# Patient Record
Sex: Female | Born: 1965 | Race: White | Hispanic: No | Smoking: Current every day smoker
Health system: Southern US, Community
[De-identification: ages and names within clinical notes are randomized; demographics above are authoritative.]

## PROBLEM LIST (undated history)

## (undated) DIAGNOSIS — M545 Low back pain, unspecified: Secondary | ICD-10-CM

## (undated) DIAGNOSIS — I10 Essential (primary) hypertension: Secondary | ICD-10-CM

## (undated) DIAGNOSIS — M359 Systemic involvement of connective tissue, unspecified: Secondary | ICD-10-CM

## (undated) DIAGNOSIS — Z780 Asymptomatic menopausal state: Secondary | ICD-10-CM

## (undated) DIAGNOSIS — M549 Dorsalgia, unspecified: Secondary | ICD-10-CM

## (undated) DIAGNOSIS — J45909 Unspecified asthma, uncomplicated: Secondary | ICD-10-CM

## (undated) DIAGNOSIS — N6002 Solitary cyst of left breast: Secondary | ICD-10-CM

## (undated) HISTORY — PX: CHOLECYSTECTOMY: SHX55

## (undated) HISTORY — DX: Solitary cyst of left breast: N60.02

## (undated) HISTORY — DX: Essential (primary) hypertension: I10

---

## 2004-08-22 ENCOUNTER — Emergency Department: Payer: Self-pay | Admitting: Emergency Medicine

## 2004-08-23 ENCOUNTER — Ambulatory Visit: Payer: Self-pay | Admitting: Emergency Medicine

## 2004-08-29 ENCOUNTER — Emergency Department: Payer: Self-pay | Admitting: Emergency Medicine

## 2004-09-09 ENCOUNTER — Ambulatory Visit (HOSPITAL_COMMUNITY): Admission: AD | Admit: 2004-09-09 | Discharge: 2004-09-09 | Payer: Self-pay | Admitting: Gynecology

## 2004-11-04 ENCOUNTER — Emergency Department: Payer: Self-pay | Admitting: General Practice

## 2004-12-30 ENCOUNTER — Emergency Department: Payer: Self-pay | Admitting: Emergency Medicine

## 2005-01-28 ENCOUNTER — Emergency Department: Payer: Self-pay | Admitting: Emergency Medicine

## 2005-07-04 ENCOUNTER — Emergency Department: Payer: Self-pay | Admitting: Emergency Medicine

## 2005-09-03 ENCOUNTER — Emergency Department: Payer: Self-pay | Admitting: Internal Medicine

## 2005-09-15 HISTORY — PX: LAPAROSCOPIC ASSISTED VAGINAL HYSTERECTOMY: SHX5398

## 2005-12-26 ENCOUNTER — Emergency Department: Payer: Self-pay | Admitting: Emergency Medicine

## 2006-03-31 ENCOUNTER — Emergency Department: Payer: Self-pay | Admitting: Unknown Physician Specialty

## 2006-04-08 ENCOUNTER — Emergency Department: Payer: Self-pay | Admitting: Emergency Medicine

## 2006-05-04 ENCOUNTER — Ambulatory Visit: Payer: Self-pay | Admitting: Gynecology

## 2006-06-18 ENCOUNTER — Encounter (INDEPENDENT_AMBULATORY_CARE_PROVIDER_SITE_OTHER): Payer: Self-pay | Admitting: Specialist

## 2006-06-18 ENCOUNTER — Ambulatory Visit: Payer: Self-pay | Admitting: Gynecology

## 2006-06-18 ENCOUNTER — Ambulatory Visit (HOSPITAL_COMMUNITY): Admission: RE | Admit: 2006-06-18 | Discharge: 2006-06-19 | Payer: Self-pay | Admitting: Gynecology

## 2006-07-16 ENCOUNTER — Ambulatory Visit: Payer: Self-pay | Admitting: *Deleted

## 2006-07-16 ENCOUNTER — Ambulatory Visit: Payer: Self-pay | Admitting: Obstetrics & Gynecology

## 2006-07-20 ENCOUNTER — Ambulatory Visit: Payer: Self-pay | Admitting: Gynecology

## 2006-09-05 ENCOUNTER — Emergency Department: Payer: Self-pay | Admitting: Emergency Medicine

## 2006-10-07 ENCOUNTER — Emergency Department: Payer: Self-pay | Admitting: Internal Medicine

## 2006-11-26 ENCOUNTER — Ambulatory Visit: Payer: Self-pay | Admitting: Anesthesiology

## 2006-12-30 ENCOUNTER — Ambulatory Visit: Payer: Self-pay | Admitting: Anesthesiology

## 2006-12-31 ENCOUNTER — Emergency Department: Payer: Self-pay | Admitting: Emergency Medicine

## 2007-06-29 ENCOUNTER — Emergency Department: Payer: Self-pay | Admitting: Internal Medicine

## 2007-11-30 ENCOUNTER — Ambulatory Visit: Payer: Self-pay | Admitting: Surgery

## 2007-12-06 ENCOUNTER — Ambulatory Visit: Payer: Self-pay | Admitting: Surgery

## 2007-12-23 ENCOUNTER — Emergency Department: Payer: Self-pay | Admitting: Emergency Medicine

## 2009-07-24 ENCOUNTER — Encounter: Payer: Self-pay | Admitting: Emergency Medicine

## 2009-08-15 ENCOUNTER — Encounter: Payer: Self-pay | Admitting: Emergency Medicine

## 2009-08-31 ENCOUNTER — Emergency Department: Payer: Self-pay | Admitting: Emergency Medicine

## 2009-12-12 ENCOUNTER — Ambulatory Visit (HOSPITAL_COMMUNITY): Admission: RE | Admit: 2009-12-12 | Discharge: 2009-12-12 | Payer: Self-pay

## 2011-01-28 NOTE — Assessment & Plan Note (Signed)
NAME:  Janet Kemp, Janet Kemp NO.:  [id number]   MEDICAL RECORD NO.:  [id number]          PATIENT TYPE:  POB   LOCATION:  [facility] at [facility]         FACILITY:  [facility]   PHYSICIAN:  Carolanne Grumbling, M.D.   DATE OF BIRTH:  1966/02/21   DATE OF SERVICE:  07/16/2006                                    CLINIC NOTE   [age]-year-old female presents with complaint of foul-smelling  vaginal discharge and drainage from her umbilical incision.  She is status  post laparoscopic vaginal hysterectomy on June 19, 2006, by Dr. Mia Creek.  She states that the vaginal discharge has been going on for weeks and that  it has changed colors.  She has douched one time last night.  Denies any  sexual intercourse or anything else in her vagina.  She is also concerned  because she had some yellowish discharge from her umbilicus.  Denies any  fevers or chills, night sweats.   PHYSICAL EXAM:  GENERAL:  Well-developed, well-nourished female in no  apparent distress.  CV:  Regular rate and rhythm.  LUNGS:  Clear to auscultation bilaterally.  ABDOMEN:  Umbilical incision showed yellow discharge with minimal erythema.  Abdomen is soft, nontender, nondistended.  GU EXAM:  Performed by Dr. Penne Lash.  Speculum exam revealed that the vaginal  cuff was friable with little induration.  No evidence of abscess or  malodorous discharge.   ASSESSMENT AND PLAN:  Status post laparoscopic vaginal hysterectomy on  June 19, 2006, with early wound infection.   PLAN:  Follow up with Dr. Mia Creek as previously scheduled on November 5.  Start Augmentin 875/125 b.i.d. x10 days.  Counseled not to have anything in  her vagina.           ______________________________  Carolanne Grumbling, M.D.     TW/MEDQ  D:  07/16/2006  T:  07/16/2006  Job:  [id number]

## 2011-01-31 NOTE — Discharge Summary (Signed)
Janet Kemp, Janet Kemp              ACCOUNT NO.:  [id number]   MEDICAL RECORD NO.:  [id number]          PATIENT TYPE:  OIB   LOCATION:  9315                          FACILITY:  [facility]   PHYSICIAN:  Ginger Carne, MD  DATE OF BIRTH:  08/13/1966   DATE OF ADMISSION:  06/18/2006  DATE OF DISCHARGE:  06/19/2006                                 DISCHARGE SUMMARY   REASON FOR HOSPITALIZATION:  Chronic pelvic pain, endometriosis.   IN-HOSPITAL PROCEDURE:  Laparoscopic-assisted vaginal hysterectomy with  preservation of both tubes and ovaries.   FINAL DIAGNOSES:  1. Stage I endometriosis.  2. Chronic pelvic pain.   HOSPITAL COURSE:  This patient is a 45 year old multiparous female who  underwent the aforementioned procedures on June 18, 2006.  Intraoperative  course was uneventful.   VITAL SIGNS:  Postoperatively, she was afebrile.  Vital signs stable.  LABORATORY:  H&H 12.4 and 35.6, postoperatively.  ABDOMEN:  Soft.  INCISIONS:  Dry.  LUNGS:  Clear.  CALVES:  Without tenderness.   Foley catheter was removed in the early a.m. of June 19, 2006.  Patient  voided satisfactorily.   Patient was discharged with routine postoperative instructions, including  contacting the office for temperature elevation above 100.4 degrees  Fahrenheit, increasing abdominal or incisional pains, increasing vaginal  discharge, bleeding vaginally, genitourinary or gastrointestinal complaints.   Patient was discharged with instructions to return in 4 weeks to the office  for follow-up postoperative visit.  She will continue her home medications  prescribed by her primary care physician, including Xanax, Phenergan,  Lipitor, Percocet, OxyContin, Flexeril, and Nexium.  All doses noted in her  medical record.      Ginger Carne, MD  Electronically Signed     SHB/MEDQ  D:  06/19/2006  T:  06/19/2006  Job:  [id number]

## 2011-01-31 NOTE — Op Note (Signed)
Janet Kemp, Janet Kemp              ACCOUNT NO.:  [id number]   MEDICAL RECORD NO.:  [id number]          PATIENT TYPE:  OIB   LOCATION:  9315                          FACILITY:  [facility]   PHYSICIAN:  Ginger Carne, MD  DATE OF BIRTH:  02-06-1966   DATE OF PROCEDURE:  06/18/2006  DATE OF DISCHARGE:  06/19/2006                                 OPERATIVE REPORT   PREOPERATIVE DIAGNOSES:  Chronic pelvic pain, endometriosis.   POSTOPERATIVE DIAGNOSES:  Chronic pelvic pain, endometriosis.   PROCEDURE:  Laparoscopic-assisted vaginal hysterectomy, preservation of both  tubes and ovaries.   SURGEON:  Ginger Carne, M.D.   ASSISTANT:  None.   COMPLICATIONS:  None immediate.   ESTIMATED BLOOD LOSS:  Minimal.   SPECIMEN:  Uterus, cervix to Pathology.   ANESTHESIA:  General.   OPERATIVE FINDINGS:  External genitalia, vulva and vagina normal.  Cervix  without erosions or lesions.  The laparoscopic evaluation demonstrated  punctate red lesions of the uterus.  Both tubes and ovaries were free of  adhesive disease, endometriosis or other pathology.  No evidence of femoral,  inguinal or obturator hernias.  Upper abdominal contents appeared normal.   OPERATIVE PROCEDURE:  The patient was prepped and draped in the usual  fashion and placed in the lithotomy position.  Betadine solution was used  for antiseptic and the patient was catheterized prior to the procedure.  After adequate general anesthesia, tenaculum placed on the anterior lip of  the cervix and a Pelosi uterine manipulator placed in same.  Afterwards a  vertical infraumbilical incision was made and the Veress needle placed in  the abdomen.  Opening and closing pressures were 10 and 15 mmHg.  Immediate  release trocar placed in the same incision and laparoscope placed in the  trocar sleeve.  Two 5-mm ports were made in the left lower quadrant and left  hypogastric regions under direct visualization.  Ureters identified  bilaterally.  Inspection of pelvic contents and upper abdomen was carried  out.  Following this, the utero-ovarian ligaments were bipolar cauterized  and cut including the tubes and round ligaments on either side.   Direction was then attended to the vaginal portion of the procedure.  Double-  tooth tenaculum placed on the anterior and posterior lips of the cervix.  Marcaine with epinephrine injected circumferentially around the cervix.  Two  centimeters of anterior and posterior vaginal epithelium were incised  transversely by cautery.  Peritoneal reflections identified and opened  without injury to their respective organs.  The uterosacral/cardinal  ligament complexes were clamped, cut and ligated with 0 Vicryl suture.  Uterine vasculature clamped, cut and ligated with 0 Vicryl suture.  Remainder of broad ligaments similarly clamped,  cut and ligated with 0  Vicryl suture.  Uterus and cervix removed.  Bleeding points hemostatically  checked.  Blood clots removed.  Irrigation performed.  Closure of the cuff  in one  layer with 0 Vicryl suture.  Following this, closure of the abdominal  incisions with 0 Vicryl for the 10-mm fascia site and 4-0 Vicryl for  subcuticular closure.  Instrument and sponge  count were correct.  The  patient tolerated the procedure well and returned to the post anesthesia  recovery room in excellent condition.      Ginger Carne, MD  Electronically Signed     SHB/MEDQ  D:  06/18/2006  T:  06/19/2006  Job:  [id number]

## 2011-01-31 NOTE — Op Note (Signed)
NAME:  Janet Kemp, Janet Kemp              ACCOUNT NO.:  [id number]   MEDICAL RECORD NO.:  [id number]          PATIENT TYPE:  AMB   LOCATION:  [facility]                          FACILITY:  [facility]   PHYSICIAN:  Ginger Carne, MD  DATE OF BIRTH:  Feb 23, 1966   DATE OF PROCEDURE:  09/09/2004  DATE OF DISCHARGE:                                 OPERATIVE REPORT   PREOPERATIVE DIAGNOSIS:  Left lower quadrant pain and lower pelvic midline  pain.   POSTOPERATIVE DIAGNOSES:  1.  Stage I endometriosis.  2.  Left omental-pelvic wall adhesions.   SURGEON:  Ginger Carne, M.D.   ASSISTANT:  None.   PROCEDURE:  Diagnostic laparoscopy with lysis of omental adhesions.   COMPLICATIONS:  None immediate.   ESTIMATED BLOOD LOSS:  Negligible.   ANESTHESIA:  General.   SPECIMENS:  None.   OPERATIVE FINDINGS:  Laparoscopic evaluation revealed a normal-sized uterus.  There were some left omental adhesions attached to the left pelvic sidewall,  which were easily removed and avascular.  The left tube and ovary as well as  the broad ligament demonstrated areas on the left side and midline of  endometriosis.  Allen Masters syndrome was present as well.  The right tube  and ovary appeared normal, appendix normal.  The patient had a previous  cholecystectomy.  Anterior bladder flap normal.  The cul-de-sac was free of  adhesive disease.  No evidence of femoral, inguinal, or obturator hernias.   OPERATIVE PROCEDURE:  The patient prepped and draped in the usual fashion  and placed in the lithotomy position, Betadine solution used for antiseptic,  and the patient was catheterized prior to the procedure.  After adequate  general anesthesia, a tenaculum placed on the anterior lip of the cervix and  the Kahn cannula placed in the endocervical canal.   Vertical infraumbilical incision was made and the Veress needle placed in  the opening.  Opening and closing pressures were 10-15 mmHg.  Medial release  trocar  placed in the same incision and the laparoscope placed in the trocar  sleeve.  A probe was placed in the left lower quadrant under direct  visualization, inspection of the pelvic contents carried out.  The  aforementioned omental adhesions were carefully lysed with sharp dissection  without  active bleeding.  Afterwards, gas released, trocars removed.  Closure of the  10 mm fascia site with 0 Vicryl and 4-0 Vicryl for subcuticular closure.  Instrument and sponge counts were correct.  The patient tolerated the  procedure well and returned to the postanesthesia recovery room in excellent  condition.     Stev   SHB/MEDQ  D:  09/09/2004  T:  09/09/2004  Job:  [id number]

## 2012-03-13 ENCOUNTER — Emergency Department (HOSPITAL_COMMUNITY)
Admission: EM | Admit: 2012-03-13 | Discharge: 2012-03-14 | Disposition: A | Discharge status: Home / Self Care | Payer: Medicaid Other | Attending: Emergency Medicine | Admitting: Emergency Medicine

## 2012-03-13 ENCOUNTER — Encounter (HOSPITAL_COMMUNITY): Payer: Self-pay | Admitting: Emergency Medicine

## 2012-03-13 DIAGNOSIS — N949 Unspecified condition associated with female genital organs and menstrual cycle: Secondary | ICD-10-CM

## 2012-03-13 DIAGNOSIS — R197 Diarrhea, unspecified: Secondary | ICD-10-CM | POA: Insufficient documentation

## 2012-03-13 DIAGNOSIS — R1084 Generalized abdominal pain: Secondary | ICD-10-CM | POA: Insufficient documentation

## 2012-03-13 DIAGNOSIS — R109 Unspecified abdominal pain: Secondary | ICD-10-CM

## 2012-03-13 DIAGNOSIS — R110 Nausea: Secondary | ICD-10-CM | POA: Insufficient documentation

## 2012-03-13 DIAGNOSIS — K921 Melena: Secondary | ICD-10-CM | POA: Insufficient documentation

## 2012-03-13 DIAGNOSIS — K625 Hemorrhage of anus and rectum: Secondary | ICD-10-CM

## 2012-03-13 DIAGNOSIS — F17200 Nicotine dependence, unspecified, uncomplicated: Secondary | ICD-10-CM | POA: Insufficient documentation

## 2012-03-13 DIAGNOSIS — Z79899 Other long term (current) drug therapy: Secondary | ICD-10-CM | POA: Insufficient documentation

## 2012-03-13 HISTORY — DX: Dorsalgia, unspecified: M54.9

## 2012-03-13 HISTORY — DX: Asymptomatic menopausal state: Z78.0

## 2012-03-13 NOTE — ED Notes (Signed)
Pt reports LUQ and pain under ribs that  Is progressively worsened over the last 2 weeks also reports lose bowels, and decreased appetite because eating increases pain , denies N/V

## 2012-03-14 ENCOUNTER — Emergency Department (HOSPITAL_COMMUNITY): Payer: Medicaid Other

## 2012-03-14 LAB — LIPASE, BLOOD: Lipase: 12 U/L (ref 11–59)

## 2012-03-14 LAB — URINALYSIS, ROUTINE W REFLEX MICROSCOPIC
Bilirubin Urine: NEGATIVE
Ketones, ur: NEGATIVE mg/dL
pH: 6.5 (ref 5.0–8.0)

## 2012-03-14 LAB — COMPREHENSIVE METABOLIC PANEL
ALT: 8 U/L (ref 0–35)
Albumin: 4 g/dL (ref 3.5–5.2)
Alkaline Phosphatase: 88 U/L (ref 39–117)
CO2: 23 mEq/L (ref 19–32)
Calcium: 9.5 mg/dL (ref 8.4–10.5)
Chloride: 100 mEq/L (ref 96–112)
Creatinine, Ser: 0.61 mg/dL (ref 0.50–1.10)
GFR calc Af Amer: >90 mL/min (ref >90)
GFR calc non Af Amer: >90 mL/min (ref >90)
Potassium: 3.2 mEq/L — ABNORMAL LOW (ref 3.5–5.1)

## 2012-03-14 LAB — POCT I-STAT, CHEM 8
BUN: 5 mg/dL — ABNORMAL LOW (ref 6–23)
Chloride: 102 mEq/L (ref 96–112)
Glucose, Bld: 92 mg/dL (ref 70–99)
Potassium: 3.1 mEq/L — ABNORMAL LOW (ref 3.5–5.1)
Sodium: 138 mEq/L (ref 135–145)

## 2012-03-14 LAB — URINE MICROSCOPIC-ADD ON

## 2012-03-14 LAB — DIFFERENTIAL
Basophils Absolute: 0 10*3/uL (ref 0.0–0.1)
Basophils Relative: 0 % (ref 0–1)
Eosinophils Absolute: 0.1 10*3/uL (ref 0.0–0.7)
Eosinophils Relative: 1 % (ref 0–5)
Lymphocytes Relative: 24 % (ref 12–46)
Monocytes Relative: 6 % (ref 3–12)

## 2012-03-14 LAB — CBC
HCT: 44.7 % (ref 36.0–46.0)
Hemoglobin: 15.7 g/dL — ABNORMAL HIGH (ref 12.0–15.0)
MCHC: 35.1 g/dL (ref 30.0–36.0)

## 2012-03-14 MED ORDER — HYDROMORPHONE HCL PF 1 MG/ML IJ SOLN
1.0000 mg | Freq: Once | INTRAMUSCULAR | Status: AC
  Administered 2012-03-14: 1 mg via INTRAVENOUS
  Filled 2012-03-14: qty 1

## 2012-03-14 MED ORDER — IOHEXOL 300 MG/ML  SOLN
100.0000 mL | Freq: Once | INTRAMUSCULAR | Status: AC | PRN
  Administered 2012-03-14: 100 mL via INTRAVENOUS

## 2012-03-14 MED ORDER — ONDANSETRON HCL 4 MG/2ML IJ SOLN
4.0000 mg | Freq: Once | INTRAMUSCULAR | Status: AC
  Administered 2012-03-14: 4 mg via INTRAVENOUS
  Filled 2012-03-14: qty 2

## 2012-03-14 MED ORDER — OXYCODONE-ACETAMINOPHEN 5-325 MG PO TABS: 1.0000 | ORAL_TABLET | ORAL | Status: AC | PRN

## 2012-03-14 MED ORDER — IOHEXOL 300 MG/ML  SOLN
20.0000 mL | INTRAMUSCULAR | Status: AC
  Administered 2012-03-14: 20 mL via ORAL

## 2012-03-14 MED ORDER — OXYCODONE-ACETAMINOPHEN 5-325 MG PO TABS
1.0000 | ORAL_TABLET | Freq: Once | ORAL | Status: AC
  Administered 2012-03-14: 1 via ORAL
  Filled 2012-03-14: qty 1

## 2012-03-14 NOTE — Discharge Instructions (Signed)
Abdominal (belly) pain can be caused by many things. any cases can be observed and treated at home after initial evaluation in the emergency department. Even though you are being discharged home, abdominal pain can be unpredictable. Therefore, you need a repeated exam if your pain does not resolve, returns, or worsens. Most patients with abdominal pain don't have to be admitted to the hospital or have surgery, but serious problems like appendicitis and gallbladder attacks can start out as nonspecific pain. Many abdominal conditions cannot be diagnosed in one visit, so follow-up evaluations are very important. SEEK IMMEDIATE MEDICAL ATTENTION IF: The pain does not go away or becomes severe, particularly over the next 8-12 hours.  A temperature above 100.60F develops.  Repeated vomiting occurs (multiple episodes).  The pain becomes localized to portions of the abdomen. The right side could possibly be appendicitis. In an adult, the left lower portion of the abdomen could be colitis or diverticulitis.  Blood is being passed in stools or vomit (bright red or black tarry stools).  Return also if you develop chest pain, difficulty breathing, dizziness or fainting, or become confused, poorly responsive  PLEASE STOP TAKING IBUPROFEN AS THIS COULD DAMAGE YOUR STOMACH  ALSO, BE SURE TO HAVE PELVIC ULTRASOUND FOR THE CYST WE TALKED ABOUT.  YOU CAN HAVE YOUR GYNECOLOGIST PERFORM THIS IN THE NEXT MONTH

## 2012-03-14 NOTE — ED Provider Notes (Signed)
History     CSN: [id number]  Arrival date & time 03/13/12  2243   First MD Initiated Contact with Patient 03/14/12 0015      Chief Complaint  Patient presents with  . Abdominal Pain    denies injury     Patient is a 46 y.o. female presenting with abdominal pain. The history is provided by the patient.  Abdominal Pain The primary symptoms of the illness include abdominal pain, nausea, diarrhea and hematochezia. The primary symptoms of the illness do not include fever, shortness of breath or vomiting. The current episode started more than 2 days ago. The onset of the illness was gradual. The problem has been gradually worsening.  Additional symptoms associated with the illness include chills. Symptoms associated with the illness do not include constipation or back pain.  Patient presents for epigastric and LUQ pain Started about week ago and has been gradually worsening The pain seems focused just below left costal margin No trauma No cp/sob No fever She denies ETOH use She does use NSAIDs daily She has never had this pain previously  Past Medical History  Diagnosis Date  . Menopause   . Back pain     Past Surgical History  Procedure Date  . Cholecystectomy   . Abdominal hysterectomy     History reviewed. No pertinent family history.  History  Substance Use Topics  . Smoking status: Current Everyday Smoker  . Smokeless tobacco: Not on file  . Alcohol Use: No    OB History    Grav Para Term Preterm Abortions TAB SAB Ect Mult Living                  Review of Systems  Constitutional: Positive for chills. Negative for fever.  Respiratory: Negative for shortness of breath.   Gastrointestinal: Positive for nausea, abdominal pain, diarrhea and hematochezia. Negative for vomiting and constipation.  Musculoskeletal: Negative for back pain.  All other systems reviewed and are negative.    Allergies  Codeine  Home Medications   Current Outpatient Rx  Name  Route Sig Dispense Refill  . ESTROGENS CONJUGATED 1.25 MG PO TABS Oral Take 1.25 mg by mouth daily.    . FUROSEMIDE 20 MG PO TABS Oral Take 20 mg by mouth daily.    Marland Kitchen METHOCARBAMOL 500 MG PO TABS Oral Take 500 mg by mouth 4 (four) times daily.    Marland Kitchen OMEPRAZOLE 20 MG PO CPDR Oral Take 20 mg by mouth daily.    . OXYCODONE-ACETAMINOPHEN 10-325 MG PO TABS Oral Take 1 tablet by mouth every 4 (four) hours as needed. For pain    . PRESCRIPTION MEDICATION Oral Take 1 tablet by mouth daily. Depression Medication    . OXYCODONE HCL ER 10 MG PO TB12 Oral Take 10 mg by mouth every 12 (twelve) hours.      BP 142/98  Pulse 68  Temp 98.5 F (36.9 C) (Oral)  Resp 18  SpO2 97%  Physical Exam CONSTITUTIONAL: Well developed/well nourished HEAD AND FACE: Normocephalic/atraumatic EYES: EOMI/PERRL, no icterus ENMT: Mucous membranes moist NECK: supple no meningeal signs SPINE:entire spine nontender CV: S1/S2 noted, no murmurs/rubs/gallops noted LUNGS: Lungs are clear to auscultation bilaterally, no apparent distress Chest - tenderness along left lower costal margin but no bruising/crepitance ABDOMEN: soft, tenderness in epigastric and LUQ, tenderness is moderate,  +BS, no rebound/guarding GU:no cva tenderness Rectal - stool color normal, hemorrhoids noted, hemoccult positive, chaperone present NEURO: Pt is awake/alert, moves all extremitiesx4 EXTREMITIES: pulses normal,  full ROM SKIN: warm, color normal PSYCH: no abnormalities of mood noted  ED Course  Procedures   Labs Reviewed  CBC - Abnormal; Notable for the following:    WBC 14.4 (*)     Hemoglobin 15.7 (*)     All other components within normal limits  DIFFERENTIAL - Abnormal; Notable for the following:    Neutro Abs 10.0 (*)     All other components within normal limits  POCT I-STAT, CHEM 8 - Abnormal; Notable for the following:    Potassium 3.1 (*)     BUN 5 (*)     Hemoglobin 16.3 (*)     HCT 48.0 (*)     All other components  within normal limits  OCCULT BLOOD, POC DEVICE  COMPREHENSIVE METABOLIC PANEL  URINALYSIS, ROUTINE W REFLEX MICROSCOPIC  LIPASE, BLOOD   12:47 AM Pt with abdominal pain but does have some rib pain as well.  She appears uncomfortable.  Will follow closely 2:03 AM Pt much improved Abdomen soft and no focal tenderness She reports some bloody stool that had resolved.  No melena on exam, stool color normal.  Did have hemoccult + Doubt acute GI bleed She does take NSAIDs daily, could have gastritis 3:11 AM PT WITH RETURN OF PAIN, WILL GET CT IMAGING 6:12 AM Pt improved We discussed ct findings, and need for outpatient workup of adnexal cyst Advised to cut back on NSAIDs Short course of pain meds given.   MDM  Nursing notes including past medical history and social history reviewed and considered in documentation All labs/vitals reviewed and considered xrays reviewed and considered        Date: 03/14/2012  Rate: 66  Rhythm: normal sinus rhythm  QRS Axis: normal  Intervals: normal  ST/T Wave abnormalities: normal  Conduction Disutrbances:none  Narrative Interpretation:   Old EKG Reviewed: none available at time of interpretation    Joya Gaskins, MD 03/14/12 [phone]

## 2012-03-14 NOTE — ED Notes (Signed)
Patient states she has been having pain to the upper quadrant areas.  Has been going on for about 1 week.  Also states that when she eats she has to go to the bathroom right away.   Abd tender to touch in upper quads

## 2013-05-28 ENCOUNTER — Emergency Department: Payer: Self-pay | Admitting: Emergency Medicine

## 2013-05-28 LAB — URINALYSIS, COMPLETE
Nitrite: NEGATIVE
Specific Gravity: 1.005 (ref 1.003–1.030)
Squamous Epithelial: >30

## 2013-05-28 LAB — CBC
HCT: 38.7 % (ref 35.0–47.0)
MCH: 32 pg (ref 26.0–34.0)
MCV: 91 fL (ref 80–100)
RBC: 4.27 10*6/uL (ref 3.80–5.20)
WBC: 14.1 10*3/uL — ABNORMAL HIGH (ref 3.6–11.0)

## 2013-05-28 LAB — DRUG SCREEN, URINE
Amphetamines, Ur Screen: NEGATIVE (ref ?–1000)
Barbiturates, Ur Screen: NEGATIVE (ref ?–200)
Benzodiazepine, Ur Scrn: NEGATIVE (ref ?–200)
Methadone, Ur Screen: NEGATIVE (ref ?–300)
Phencyclidine (PCP) Ur S: NEGATIVE (ref ?–25)

## 2013-05-28 LAB — ETHANOL: Ethanol: <3 mg/dL

## 2013-05-28 LAB — COMPREHENSIVE METABOLIC PANEL
Alkaline Phosphatase: 131 U/L (ref 50–136)
Anion Gap: 8 (ref 7–16)
Bilirubin,Total: 0.4 mg/dL (ref 0.2–1.0)
Chloride: 98 mmol/L (ref 98–107)
Creatinine: 0.89 mg/dL (ref 0.60–1.30)
Osmolality: 267 (ref 275–301)
SGPT (ALT): 38 U/L (ref 12–78)
Total Protein: 8.9 g/dL — ABNORMAL HIGH (ref 6.4–8.2)

## 2013-05-30 LAB — URINALYSIS, COMPLETE
Nitrite: NEGATIVE
Ph: 6 (ref 4.5–8.0)
RBC,UR: 5 /HPF (ref 0–5)
Specific Gravity: 1.005 (ref 1.003–1.030)
Squamous Epithelial: 19
WBC UR: 29 /HPF (ref 0–5)

## 2014-03-08 ENCOUNTER — Encounter (HOSPITAL_COMMUNITY): Payer: Self-pay | Admitting: Emergency Medicine

## 2014-03-08 ENCOUNTER — Emergency Department (HOSPITAL_COMMUNITY)
Admission: EM | Admit: 2014-03-08 | Discharge: 2014-03-08 | Disposition: A | Discharge status: Home / Self Care | Payer: Medicaid Other | Attending: Emergency Medicine | Admitting: Emergency Medicine

## 2014-03-08 DIAGNOSIS — Z780 Asymptomatic menopausal state: Secondary | ICD-10-CM | POA: Insufficient documentation

## 2014-03-08 DIAGNOSIS — M25561 Pain in right knee: Secondary | ICD-10-CM

## 2014-03-08 DIAGNOSIS — G8929 Other chronic pain: Secondary | ICD-10-CM | POA: Insufficient documentation

## 2014-03-08 DIAGNOSIS — IMO0002 Reserved for concepts with insufficient information to code with codable children: Secondary | ICD-10-CM | POA: Insufficient documentation

## 2014-03-08 DIAGNOSIS — F17200 Nicotine dependence, unspecified, uncomplicated: Secondary | ICD-10-CM | POA: Insufficient documentation

## 2014-03-08 DIAGNOSIS — L089 Local infection of the skin and subcutaneous tissue, unspecified: Secondary | ICD-10-CM

## 2014-03-08 DIAGNOSIS — L723 Sebaceous cyst: Secondary | ICD-10-CM | POA: Insufficient documentation

## 2014-03-08 DIAGNOSIS — Z79899 Other long term (current) drug therapy: Secondary | ICD-10-CM | POA: Insufficient documentation

## 2014-03-08 DIAGNOSIS — M25569 Pain in unspecified knee: Secondary | ICD-10-CM | POA: Insufficient documentation

## 2014-03-08 MED ORDER — NAPROXEN 500 MG PO TABS: 500.0000 mg | ORAL_TABLET | Freq: Two times a day (BID) | ORAL | Status: DC

## 2014-03-08 MED ORDER — TRAMADOL HCL 50 MG PO TABS: 50.0000 mg | ORAL_TABLET | Freq: Four times a day (QID) | ORAL | Status: DC | PRN

## 2014-03-08 NOTE — Discharge Instructions (Signed)
Keep wound clean and dry. Change dressing twice a day. Take pain medications as prescribed. Ice and elevate your knee at home. Knee sleeve for compression. Follow up in 2 days for abscess recheck and packing removal.   Abscess An abscess is an infected area that contains a collection of pus and debris.It can occur in almost any part of the body. An abscess is also known as a furuncle or boil. CAUSES  An abscess occurs when tissue gets infected. This can occur from blockage of oil or sweat glands, infection of hair follicles, or a minor injury to the skin. As the body tries to fight the infection, pus collects in the area and creates pressure under the skin. This pressure causes pain. People with weakened immune systems have difficulty fighting infections and get certain abscesses more often.  SYMPTOMS Usually an abscess develops on the skin and becomes a painful mass that is red, warm, and tender. If the abscess forms under the skin, you may feel a moveable soft area under the skin. Some abscesses break open (rupture) on their own, but most will continue to get worse without care. The infection can spread deeper into the body and eventually into the bloodstream, causing you to feel ill.  DIAGNOSIS  Your caregiver will take your medical history and perform a physical exam. A sample of fluid may also be taken from the abscess to determine what is causing your infection. TREATMENT  Your caregiver may prescribe antibiotic medicines to fight the infection. However, taking antibiotics alone usually does not cure an abscess. Your caregiver may need to make a small cut (incision) in the abscess to drain the pus. In some cases, gauze is packed into the abscess to reduce pain and to continue draining the area. HOME CARE INSTRUCTIONS   Only take over-the-counter or prescription medicines for pain, discomfort, or fever as directed by your caregiver.  If you were prescribed antibiotics, take them as directed.  Finish them even if you start to feel better.  If gauze is used, follow your caregiver's directions for changing the gauze.  To avoid spreading the infection:  Keep your draining abscess covered with a bandage.  Wash your hands well.  Do not share personal care items, towels, or whirlpools with others.  Avoid skin contact with others.  Keep your skin and clothes clean around the abscess.  Keep all follow-up appointments as directed by your caregiver. SEEK MEDICAL CARE IF:   You have increased pain, swelling, redness, fluid drainage, or bleeding.  You have muscle aches, chills, or a general ill feeling.  You have a fever. MAKE SURE YOU:   Understand these instructions.  Will watch your condition.  Will get help right away if you are not doing well or get worse. Document Released: 06/11/2005 Document Revised: 03/02/2012 Document Reviewed: 11/14/2011 [facility] Patient Information 2015 [place]. This information is not intended to replace advice given to you by your health care provider. Make sure you discuss any questions you have with your health care provider.  Knee Pain The knee is the complex joint between your thigh and your lower leg. It is made up of bones, tendons, ligaments, and cartilage. The bones that make up the knee are:  The femur in the thigh.  The tibia and fibula in the lower leg.  The patella or kneecap riding in the groove on the lower femur. CAUSES  Knee pain is a common complaint with many causes. A few of these causes are:  Injury,  such as:  A ruptured ligament or tendon injury.  Torn cartilage.  Medical conditions, such as:  Gout  Arthritis  Infections  Overuse, over training, or overdoing a physical activity. Knee pain can be minor or severe. Knee pain can accompany debilitating injury. Minor knee problems often respond well to self-care measures or get well on their own. More serious injuries may need medical intervention or  even surgery. SYMPTOMS The knee is complex. Symptoms of knee problems can vary widely. Some of the problems are:  Pain with movement and weight bearing.  Swelling and tenderness.  Buckling of the knee.  Inability to straighten or extend your knee.  Your knee locks and you cannot straighten it.  Warmth and redness with pain and fever.  Deformity or dislocation of the kneecap. DIAGNOSIS  Determining what is wrong may be very straight forward such as when there is an injury. It can also be challenging because of the complexity of the knee. Tests to make a diagnosis may include:  Your caregiver taking a history and doing a physical exam.  Routine X-rays can be used to rule out other problems. X-rays will not reveal a cartilage tear. Some injuries of the knee can be diagnosed by:  Arthroscopy a surgical technique by which a small video camera is inserted through tiny incisions on the sides of the knee. This procedure is used to examine and repair internal knee joint problems. Tiny instruments can be used during arthroscopy to repair the torn knee cartilage (meniscus).  Arthrography is a radiology technique. A contrast liquid is directly injected into the knee joint. Internal structures of the knee joint then become visible on X-ray film.  An MRI scan is a non X-ray radiology procedure in which magnetic fields and a computer produce two- or three-dimensional images of the inside of the knee. Cartilage tears are often visible using an MRI scanner. MRI scans have largely replaced arthrography in diagnosing cartilage tears of the knee.  Blood work.  Examination of the fluid that helps to lubricate the knee joint (synovial fluid). This is done by taking a sample out using a needle and a syringe. TREATMENT The treatment of knee problems depends on the cause. Some of these treatments are:  Depending on the injury, proper casting, splinting, surgery, or physical therapy care will be  needed.  Give yourself adequate recovery time. Do not overuse your joints. If you begin to get sore during workout routines, back off. Slow down or do fewer repetitions.  For repetitive activities such as cycling or running, maintain your strength and nutrition.  Alternate muscle groups. For example, if you are a weight lifter, work the upper body on one day and the lower body the next.  Either tight or weak muscles do not give the proper support for your knee. Tight or weak muscles do not absorb the stress placed on the knee joint. Keep the muscles surrounding the knee strong.  Take care of mechanical problems.  If you have flat feet, orthotics or special shoes may help. See your caregiver if you need help.  Arch supports, sometimes with wedges on the inner or outer aspect of the heel, can help. These can shift pressure away from the side of the knee most bothered by osteoarthritis.  A brace called an "unloader" brace also may be used to help ease the pressure on the most arthritic side of the knee.  If your caregiver has prescribed crutches, braces, wraps or ice, use as directed. The acronym  for this is PRICE. This means protection, rest, ice, compression, and elevation.  Nonsteroidal anti-inflammatory drugs (NSAIDs), can help relieve pain. But if taken immediately after an injury, they may actually increase swelling. Take NSAIDs with food in your stomach. Stop them if you develop stomach problems. Do not take these if you have a history of ulcers, stomach pain, or bleeding from the bowel. Do not take without your caregiver's approval if you have problems with fluid retention, heart failure, or kidney problems.  For ongoing knee problems, physical therapy may be helpful.  Glucosamine and chondroitin are over-the-counter dietary supplements. Both may help relieve the pain of osteoarthritis in the knee. These medicines are different from the usual anti-inflammatory drugs. Glucosamine may  decrease the rate of cartilage destruction.  Injections of a corticosteroid drug into your knee joint may help reduce the symptoms of an arthritis flare-up. They may provide pain relief that lasts a few months. You may have to wait a few months between injections. The injections do have a small increased risk of infection, water retention, and elevated blood sugar levels.  Hyaluronic acid injected into damaged joints may ease pain and provide lubrication. These injections may work by reducing inflammation. A series of shots may give relief for as long as 6 months.  Topical painkillers. Applying certain ointments to your skin may help relieve the pain and stiffness of osteoarthritis. Ask your pharmacist for suggestions. Many over the-counter products are approved for temporary relief of arthritis pain.  In some countries, doctors often prescribe topical NSAIDs for relief of chronic conditions such as arthritis and tendinitis. A review of treatment with NSAID creams found that they worked as well as oral medications but without the serious side effects. PREVENTION  Maintain a healthy weight. Extra pounds put more strain on your joints.  Get strong, stay limber. Weak muscles are a common cause of knee injuries. Stretching is important. Include flexibility exercises in your workouts.  Be smart about exercise. If you have osteoarthritis, chronic knee pain or recurring injuries, you may need to change the way you exercise. This does not mean you have to stop being active. If your knees ache after jogging or playing basketball, consider switching to swimming, water aerobics, or other low-impact activities, at least for a few days a week. Sometimes limiting high-impact activities will provide relief.  Make sure your shoes fit well. Choose footwear that is right for your sport.  Protect your knees. Use the proper gear for knee-sensitive activities. Use kneepads when playing volleyball or laying carpet.  Buckle your seat belt every time you drive. Most shattered kneecaps occur in car accidents.  Rest when you are tired. SEEK MEDICAL CARE IF:  You have knee pain that is continual and does not seem to be getting better.  SEEK IMMEDIATE MEDICAL CARE IF:  Your knee joint feels hot to the touch and you have a high fever. MAKE SURE YOU:   Understand these instructions.  Will watch your condition.  Will get help right away if you are not doing well or get worse. Document Released: 06/29/2007 Document Revised: 11/24/2011 Document Reviewed: 06/29/2007 [facility] Patient Information 2015 [place], [place]. This information is not intended to replace advice given to you by your health care provider. Make sure you discuss any questions you have with your health care provider.

## 2014-03-08 NOTE — ED Notes (Signed)
Pt. Stated, i have a cyst on my back and my rt. Knee hurts. For 2 weeks. Abscess in the center of back.

## 2014-03-08 NOTE — ED Provider Notes (Signed)
CSN: [id number]     Arrival date & time 03/08/14  1031 History  This chart was scribed for non-physician practitioner, Lottie Musselatyana A Kirichenko, PA-C, working with Laray AngerKathleen M McManus, DO by Shari HeritageAisha Amuda, ED Scribe. This patient was seen in room TR09C/TR09C and the patient's care was started at 11:00 AM.  Chief Complaint  Patient presents with  . Abscess  . Knee Pain    The history is provided by the patient. No language interpreter was used.    HPI Comments: Janet Kemp is a 48 y.o. female who presents to the Emergency Department complaining of an area of redness, swelling and severe pain to her right upper back. Patient states that she first developed redness and swelling about 3 weeks ago, and then 1 week ago, the area became painful and has progressively worsened since this. Patient states that she has a history of sebaceous cyst to this same spot that was drained in the past. She further reports that she [place] about [place] it removed, but they were unable to do this because it had already been lanced. Patient denies fever, chills, nausea, or vomiting.   She is also complaining of right knee pain with associated swelling for the past 6 days. 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 She states that current episode of pain began after long periods of activity and standing at work. Pain is worse with range of motion of the knee and weight bearing. There is no numbness or weakness of the extremities.    Past Medical History  Diagnosis Date  . Menopause   . Back pain    Past Surgical History  Procedure Laterality Date  . Cholecystectomy    . Abdominal hysterectomy     No family history on file. History  Substance Use Topics  . Smoking status: Current Every Day Smoker  . Smokeless tobacco: Not on file  . Alcohol Use: No   OB History   Grav Para Term Preterm Abortions TAB SAB Ect  Mult Living                 Review of Systems  Constitutional: Negative for fever and chills.  Gastrointestinal: Negative for nausea and vomiting.  Musculoskeletal: Positive for arthralgias (right knee pain).  Skin: Positive for wound.  Neurological: Negative for weakness and numbness.      Allergies  Codeine  Home Medications   Prior to Admission medications   Medication Sig Start Date End Date Taking? Authorizing Provider  estrogens, conjugated, (PREMARIN) 1.25 MG tablet Take 1.25 mg by mouth daily.    Historical Provider, MD  furosemide (LASIX) 20 MG tablet Take 20 mg by mouth daily.    Historical Provider, MD  methocarbamol (ROBAXIN) 500 MG tablet Take 500 mg by mouth 4 (four) times daily.    Historical Provider, MD  omeprazole (PRILOSEC) 20 MG capsule Take 20 mg by mouth daily.    Historical Provider, MD  oxyCODONE (OXYCONTIN) 10 MG 12 hr tablet Take 10 mg by mouth every 12 (twelve) hours.    Historical Provider, MD  oxyCODONE-acetaminophen (PERCOCET) 10-325 MG per tablet Take 1 tablet by mouth every 4 (four) hours as needed. For pain    Historical Provider, MD  PRESCRIPTION MEDICATION Take 1 tablet by mouth daily. Depression Medication    Historical Provider, MD   Triage Vitals: BP 111/90  Pulse 100  Temp(Src) 98  F (36.7 C) (Oral)  Resp 16  Ht 5\' 3"  (1.6 m)  Wt 170 lb (77.111 kg)  BMI 30.12 kg/m2  SpO2 97% Physical Exam  Nursing note and vitals reviewed. Constitutional: She is oriented to person, place, and time. She appears well-developed and well-nourished. No distress.  HENT:  Head: Normocephalic and atraumatic.  Eyes: Conjunctivae and EOM are normal.  Neck: Neck supple. No tracheal deviation present.  Cardiovascular: Normal rate.   Pulmonary/Chest: Effort normal. No respiratory distress.  Musculoskeletal: Normal range of motion.  Normal appearing right knee. Tender to palpation over anterior knee. Patella tendon intact. Pain with movement of patella.  Full  ROM of the knee joint. Pain with full flexion and extension. Negative anterior and posterior drawer signs. No laxity or pain with medial or lateral stress.    Neurological: She is alert and oriented to person, place, and time.  Skin: Skin is warm and dry.  3x3cm abscess. Erythematous. Tender to palpation. Fluctuant. No surrounding erythema or cellulitic changes  Psychiatric: She has a normal mood and affect. Her behavior is normal.    ED Course  Procedures (including critical care time) DIAGNOSTIC STUDIES: Oxygen Saturation is 97% on room air, normal by my interpretation.    COORDINATION OF CARE: 11:04 AM- Performed I&D of cyst to right upper back. Encouraged patient to follow up with dermatologist for removal if cyst recurs. Patient informed of current plan for treatment and evaluation and agrees with plan at this time.  INCISION AND DRAINAGE PROCEDURE NOTE: Patient identification was confirmed and verbal consent was obtained. This procedure was performed by Lottie Musselatyana A Kirichenko, PA-C at 11:04 AM. Site: right upper back Sterile procedures observed Needle size: 25 gauge Anesthetic used (type and amt): 2% lidocaine with epi, 2 cc Blade size: 11 Drainage: large amount of purulent drainage Complexity: Complex Packing used: 1/4 inch iodoform gauze Site anesthetized, incision made over site, wound drained and explored loculations, rinsed with copious amounts of normal saline, wound packed with sterile gauze, covered with dry, sterile dressing.  Pt tolerated procedure well without complications.  Instructions for care discussed verbally and pt provided with additional written instructions for homecare and f/u.   MDM   Final diagnoses:  None    Right knee pain. Chronic, acute exacerbation. No injuries. No indication for imaging. Home with naprosyn, ultram, ice, elevation. Knee sleeve provided for support and swelling.   Abcess/cyst I&Ded with large purulent drainage. Packed. I do  not think pt will need antibiotic treatment. Follow up in 2 days for recheck and packing removal.   Filed Vitals:   03/08/14 1046  BP: 111/90  Pulse: 100  Temp: 98 F (36.7 C)  TempSrc: Oral  Resp: 16  Height: 5\' 3"  (1.6 m)  Weight: 170 lb (77.111 kg)  SpO2: 97%    I personally performed the services described in this documentation, which was scribed in my presence. The recorded information has been reviewed and is accurate.      Lottie Musselatyana A Kirichenko, PA-C 03/08/14 1134

## 2014-03-10 NOTE — ED Provider Notes (Signed)
Medical screening examination/treatment/procedure(s) were performed by non-physician practitioner and as supervising physician I was immediately available for consultation/collaboration.   EKG Interpretation None        Kathleen M McManus, DO 03/10/14 0916 

## 2015-01-05 NOTE — Consult Note (Signed)
Brief Consult Note: Diagnosis: Major depressive disorder, recurrent.   Patient was seen by consultant.   Consult note dictated.   Recommend further assessment or treatment.   Orders entered.   Comments: Comments: [name] has a h/o depression on Viibryd. She was brought to the ER after she became agitated at the police station. She is now cool and collected. She is not suicidal, homicidal or overtly psychotic.Marland Kitchen.   PLAN: 1. The patient no longer meets criteria for IVC. I will terminate proceedings. Please discharge as appropriate.   2. She will follow up with Dr. Lacie ScottsNiemeyer for medication mangment and Dr. Peggye Pittichards for therapy and at [facility] [place] for [place].  3. No medication changes recommended.  Electronic Signatures: Kristine LineaPucilowska, Bettejane Leavens (MD)  (Signed 15-Sep-14 14:01)  Authored: Brief Consult Note   Last Updated: 15-Sep-14 14:01 by Kristine LineaPucilowska, Caelan Branden (MD)

## 2015-01-05 NOTE — Consult Note (Signed)
PATIENT NAME:  Janet Kemp, Janet Kemp MR#:  [id number] DATE OF BIRTH:  [birth year]  DATE OF CONSULTATION:  05/30/2013  REFERRING PHYSICIAN:  Daryel November, MD CONSULTING PHYSICIAN:  Jolanta B. Pucilowska, MD  REASON FOR CONSULTATION: To evaluate a psychotic patient.   IDENTIFYING DATA: Janet Kemp is a 49 year old female with history of mild depression.   CHIEF COMPLAINT: "It's a misunderstanding."   HISTORY OF PRESENT ILLNESS: Janet Kemp was brought to the Emergency Room on petition from her family. Reportedly, she has been hallucinating. The patient tells me that she got really upset after her daughter was arrested and she was worried that her 81-year-old grandson will end up in foster care so following arrest of her daughter  of her talk to him immediately she proceeded to the police station to report that her daughter has multiple charges. She got agitated at the police station and was brought to the Emergency Room. The patient reports a history of mild depression for which she has been prescribed Viibryd by Dr. Lacie Scotts, her primary care provider. She denies any symptoms of depression, anxiety or psychosis. Per her IVC papers, the patient has been hearing voices and receiving messages from TV. She adamantly denied this to intake nurse or myself. She does make strange statement that Beckey Rutter from the [employer] can project things on the wall, but adamantly denies auditory hallucinations, especially command hallucinations and believes that everybody could see the projection on her walls. She cannot explain it better. The patient denies alcohol, illicit substance or prescription drug abuse. She admitted that she is a patient at the [facility] in [place] where she receives a prescription for extended release morphine and Percocet. She reports good compliance with medications and is in good standing with them. Indeed her urine tox screen is positive for opiates. This is prescribed for neck and lower back  pain. Her primary care provider is Dr. Lacie Scotts who also prescribes psychotropic medications.   PAST PSYCHIATRIC HISTORY: The patient denies prior hospitalizations. She does not have a psychiatrist. There were no suicide attempts. She adamantly denies ever having psychotic symptoms.   FAMILY PSYCHIATRIC HISTORY: None reported.   PAST MEDICAL HISTORY: Dyslipidemia, chronic pain, GERD, migraine headache, asthma, irritable bowel syndrome.  ADMISSION MEDICATIONS: 1.  Crestor 20 mg daily. 2.  Viibryd 40 mg daily. 3.  Morphine and oxycodone, unknown doses.   ALLERGIES: No known drug allergies.   SOCIAL HISTORY: The patient lives by herself in a trailer. She works at a [employer]. She used to take care of her grandson up until recently. Her daughter moved out of the patient's house and moved in with her sister, but they did not get along well. Then she returned to the patient's trailer and left again just last Friday and moved in with a boyfriend. The patient is rather worried about her grandson and also misses him as she is very attached to him.  REVIEW OF SYSTEMS: CONSTITUTIONAL: No fevers or chills. No weight changes.  EYES: No double or blurred vision.  ENT: No hearing loss.  RESPIRATORY: No shortness of breath or cough.  CARDIOVASCULAR: No chest pain or orthopnea.  GASTROINTESTINAL: No abdominal pain, nausea, vomiting or diarrhea.  GENITOURINARY: No incontinence or frequency.  ENDOCRINE: No heat or cold intolerance.  LYMPHATIC: No anemia or easy bruising.  INTEGUMENTARY: No acne or rash.  MUSCULOSKELETAL: No muscle or joint pain.  NEUROLOGIC: No tingling or weakness.  PSYCHIATRIC: See history of present illness for details.   PHYSICAL EXAMINATION:  VITAL SIGNS: Blood pressure 123/83, pulse 88, respirations 14, temperature 98.  GENERAL: This is a well-developed female in no acute distress. The rest of the physical examination is deferred to her primary attending.    LABORATORY DATA: Chemistries: Glucose 156, BUN 1, creatinine 0.89, sodium 134, potassium 2.4. Blood alcohol level is zero. LFTs within normal limits, except for AST of 43. TSH 0.59. Urine tox screen is positive for opiates. CBC is within normal limits, except for elevated white blood count of 14.1 and platelets 489. Urinalysis: 3+ leukocyte esterase and over 30 white blood cells with mixed bacteria on urine culture.   MENTAL STATUS EXAMINATION: The patient is alert and oriented to person, place, time and situation. She is pleasant, polite and cooperative. She is in bed in the Emergency Room wearing hospital scrubs. She maintains good eye contact. Her speech is soft. Mood is fine with full affect. Thought process is logical and goal oriented. Thought content: She denies thoughts of hurting herself or others. There are no delusions or paranoia. She denies auditory or visual hallucinations. Prior to admission her family reported that the patient was getting messages from TV. She denies any command hallucinations or messages telling her to hurt herself or others. Her cognition is grossly intact. She registers 3 out of 3 and recalls 3 out of 3 objects after five minutes. She can spell world forward and backward. She knows the current president. Her insight and judgment are fair.   SUICIDE RISK ASSESSMENT: This is a patient with history of mild depression who per family developed some psychotic symptoms. She has no history of schizophrenia. She denies substance use. She is a loving mother and grandmother. She is functioning well and maintaining employment and taking care of her family. She is forward thinking and optimistic about the future   DIAGNOSES: AXIS I: Major depressive disorder.  AXIS II: Deferred.  AXIS III: Chronic pain, dyslipidemia, gastroesophageal reflux disease.  AXIS IV: Global assessment of functioning of 57.   PLAN:  1.  The patient no longer meets criteria for involuntary inpatient  psychiatric commitment. I will terminate proceedings. Please discharge as appropriate.  2.  She wishes to follow up with Dr. Lacie ScottsNiemeyer for medication management and Dr. Peggye Pittichards her therapist whom she sees on an as needed basis. 3.  She will [place] [facility] [place].  4.  No medication change recommended. ____________________________ Ellin GoodieJolanta B. Jennet MaduroPucilowska, MD jbp:sb D: 05/31/2013 11:45:44 ET T: 05/31/2013 12:22:38 ET JOB#: [id number]  cc: Jolanta B. Jennet MaduroPucilowska, MD, <Dictator> Shari ProwsJOLANTA B PUCILOWSKA MD ELECTRONICALLY SIGNED 06/06/2013 6:54

## 2015-05-21 ENCOUNTER — Emergency Department
Admission: EM | Admit: 2015-05-21 | Discharge: 2015-05-21 | Disposition: A | Discharge status: Home / Self Care | Payer: 59 | Attending: Emergency Medicine | Admitting: Emergency Medicine

## 2015-05-21 ENCOUNTER — Encounter: Payer: Self-pay | Admitting: *Deleted

## 2015-05-21 DIAGNOSIS — Z720 Tobacco use: Secondary | ICD-10-CM | POA: Insufficient documentation

## 2015-05-21 DIAGNOSIS — Z791 Long term (current) use of non-steroidal anti-inflammatories (NSAID): Secondary | ICD-10-CM | POA: Diagnosis not present

## 2015-05-21 DIAGNOSIS — L02212 Cutaneous abscess of back [any part, except buttock]: Secondary | ICD-10-CM | POA: Diagnosis present

## 2015-05-21 DIAGNOSIS — Z79899 Other long term (current) drug therapy: Secondary | ICD-10-CM | POA: Insufficient documentation

## 2015-05-21 DIAGNOSIS — L723 Sebaceous cyst: Secondary | ICD-10-CM | POA: Insufficient documentation

## 2015-05-21 DIAGNOSIS — L089 Local infection of the skin and subcutaneous tissue, unspecified: Secondary | ICD-10-CM

## 2015-05-21 MED ORDER — CLINDAMYCIN HCL 300 MG PO CAPS: 300.0000 mg | ORAL_CAPSULE | Freq: Four times a day (QID) | ORAL | Status: DC

## 2015-05-21 MED ORDER — HYDROCODONE-ACETAMINOPHEN 5-325 MG PO TABS: 1.0000 | ORAL_TABLET | Freq: Three times a day (TID) | ORAL | Status: DC | PRN

## 2015-05-21 MED ORDER — LIDOCAINE-EPINEPHRINE (PF) 1 %-1:200000 IJ SOLN
30.0000 mL | Freq: Once | INTRAMUSCULAR | Status: DC
  Filled 2015-05-21: qty 30

## 2015-05-21 NOTE — ED Notes (Addendum)
Pt presents w/ c/o infected cyst on back that is chronic and recurrent in nature x 5 years. Pt states pain and swelling began x 2 weeks ago.

## 2015-05-21 NOTE — ED Provider Notes (Signed)
[facility] Emergency Department Provider Note ?____________________________________________ ? Time seen: 0725 ? I have reviewed the triage vital signs and the nursing notes. ________ HISTORY ? Chief Complaint Abscess  HPI  Janet Kemp is a 49 y.o. female reports to the ED for evaluation of an infected, chronic cyst on her back. She last had it drained about a year and a half ago. She denies any fevers, chills, sweats or nausea and vomiting.   Past Medical History  Diagnosis Date  . Menopause   . Back pain    There are no active problems to display for this patient. ? Past Surgical History  Procedure Laterality Date  . Cholecystectomy    . Abdominal hysterectomy     ? Current Outpatient Rx  Name  Route  Sig  Dispense  Refill  . clindamycin (CLEOCIN) 300 MG capsule   Oral   Take 1 capsule (300 mg total) by mouth 4 (four) times daily.   40 capsule   0   . estradiol (ESTRACE) 0.5 MG tablet   Oral   Take 0.5 mg by mouth daily.         Marland Kitchen HYDROcodone-acetaminophen (NORCO) 5-325 MG per tablet   Oral   Take 1 tablet by mouth every 8 (eight) hours as needed for moderate pain.   12 tablet   0   . ibuprofen (ADVIL,MOTRIN) 200 MG tablet   Oral   Take 200-800 mg by mouth every 6 (six) hours as needed (pain).         . naproxen (NAPROSYN) 500 MG tablet   Oral   Take 1 tablet (500 mg total) by mouth 2 (two) times daily.   30 tablet   0   . omeprazole (PRILOSEC) 40 MG capsule   Oral   Take 40 mg by mouth daily.         . traMADol (ULTRAM) 50 MG tablet   Oral   Take 1 tablet (50 mg total) by mouth every 6 (six) hours as needed.   15 tablet   0    Allergies Codeine ? History reviewed. No pertinent family history. ? Social History Social History  Substance Use Topics  . Smoking status: Current Every Day Smoker  . Smokeless tobacco: None  . Alcohol Use: No   Review of Systems Constitutional: Negative for fever. HEENT:   Normocephalic/atraumatic. Negative for visual/hearingchanges, sore throat, or nasal congestion. Cardiovascular: Negative for chest pain. Respiratory: Negative for shortness of breath. Musculoskeletal: Negative for back pain. Skin: tender swollen cyst Neurological: Negative for headaches, focal weakness or numbness. Hematological/Lymphatic:Negative for enlarged lymph nodes  10-point ROS otherwise negative. ____________________________________________  PHYSICAL EXAM:  VITAL SIGNS: ED Triage Vitals  Enc Vitals Group     BP 05/21/15 0601 114/68 mmHg     Pulse Rate 05/21/15 0601 82     Resp 05/21/15 0601 20     Temp 05/21/15 0601 98.5 F (36.9 C)     Temp Source 05/21/15 0601 Oral     SpO2 05/21/15 0601 96 %     Weight 05/21/15 0601 18 lb (8.165 kg)     Height 05/21/15 0601  (1.6 m)     Head Cir --      Peak Flow --      Pain Score 05/21/15 0602 8     Pain Loc --      Pain Edu? --      Excl. in GC? --    Constitutional: Alert and oriented.  Well appearing and in no distress. HEENT: Normocephalic and atraumatic.Conjunctivae are normal. PERRL. Normal extraocular movements. Mucous membranes are moist. Hematological/Lymphatic/Immunological: No cervical lymphadenopathy. Cardiovascular: Normal rate, regular rhythm.No murmurs, rubs, or gallops.  Respiratory: Normal respiratory effort. Breath sounds are clear and equal bilaterally. No wheezes/rales/rhonchi. Gastrointestinal: Soft and non tender. No distention.  Genitourinary: deferred Musculoskeletal: Nontender with normal range of motion in all extremities. No joint effusions.  No lower extremity tenderness nor edema. Neurologic:  Normal speech and language. No gross focal neurologic deficits are appreciated.  Skin:  Midback with a single, cystic formation centrally located between the shoulder blades. It is firm, tender and erythematous. Psychiatric: Mood and affect are normal. Speech and behavior are normal. Patient exhibits  appropriate insight and judgment. _______________________ INCISION AND DRAINAGE  Performed by: Lissa Hoard Consent: Verbal consent obtained. Risks and benefits: risks, benefits and alternatives were discussed Type: abscess  Body area: midback  Anesthesia: local infiltration  Incision was made with a scalpel.  Local anesthetic: lidocaine 1% w/ epinephrine  Anesthetic total: 5 ml  Complexity: complex Blunt dissection to break up loculations  Drainage: purulent  Drainage amount: 2 ml  Packing material: 1/4 in iodoform gauze  Patient tolerance: Patient tolerated the procedure well with no immediate complications. ______________________________________________________ INITIAL IMPRESSION / ASSESSMENT AND PLAN / ED COURSE ? Incision and drainage of an acutely infected sebaceous cyst to the mid back. Wound care provided. Prescriptions for Vicodin # 15 and clindamycin provided. Patient is to return to 3 days for packing removal and wound check. ____________________________________________ FINAL CLINICAL IMPRESSION(S) / ED DIAGNOSES?  Final diagnoses:  Infected sebaceous cyst of skin       Lissa Hoard, PA-C 05/21/15 1610  Jennye Moccasin, MD 05/21/15 1003

## 2015-05-21 NOTE — ED Notes (Signed)
Pt here with abscess to upper back, chronic in nature.  Pt advises it has to be I&D before.

## 2015-05-21 NOTE — Discharge Instructions (Signed)
° °  Epidermal Cyst An epidermal cyst is sometimes called a sebaceous cyst, epidermal inclusion cyst, or infundibular cyst. These cysts usually contain a substance that looks "pasty" or "cheesy" and may have a bad smell. This substance is a protein called keratin. Epidermal cysts are usually found on the face, neck, or trunk. They may also occur in the vaginal area or other parts of the genitalia of both men and women. Epidermal cysts are usually small, painless, slow-growing bumps or lumps that move freely under the skin. It is important not to try to pop them. This may cause an infection and lead to tenderness and swelling. CAUSES  Epidermal cysts may be caused by a deep penetrating injury to the skin or a plugged hair follicle, often associated with acne. SYMPTOMS  Epidermal cysts can become inflamed and cause:  Redness.  Tenderness.  Increased temperature of the skin over the bumps or lumps.  Grayish-white, bad smelling material that drains from the bump or lump. DIAGNOSIS  Epidermal cysts are easily diagnosed by your caregiver during an exam. Rarely, a tissue sample (biopsy) may be taken to rule out other conditions that may resemble epidermal cysts. TREATMENT   Epidermal cysts often get better and disappear on their own. They are rarely ever cancerous.  If a cyst becomes infected, it may become inflamed and tender. This may require opening and draining the cyst. Treatment with antibiotics may be necessary. When the infection is gone, the cyst may be removed with minor surgery.  Small, inflamed cysts can often be treated with antibiotics or by injecting steroid medicines.  Sometimes, epidermal cysts become large and bothersome. If this happens, surgical removal in your caregiver's office may be necessary. HOME CARE INSTRUCTIONS  Only take over-the-counter or prescription medicines as directed by your caregiver.  Take your antibiotics as directed. Finish them even if you start to feel  better. SEEK MEDICAL CARE IF:   Your cyst becomes tender, red, or swollen.  Your condition is not improving or is getting worse.  You have any other questions or concerns. MAKE SURE YOU:  Understand these instructions.  Will watch your condition.  Will get help right away if you are not doing well or get worse. Document Released: 08/02/2004 Document Revised: 11/24/2011 Document Reviewed: 03/10/2011 [facility] Patient Information 2015 [place], [place]. This information is not intended to replace advice given to you by your health care provider. Make sure you discuss any questions you have with your health care provider.  Take the antibiotic as directed until completely gone. Take the pain medicine as needed.  Apply warm compresses to promote healing.  Follow-up with your provider or [facility] for recheck. See Dermatology for definitive removal of the cyst.

## 2015-11-16 ENCOUNTER — Encounter: Payer: Self-pay | Admitting: *Deleted

## 2015-11-19 ENCOUNTER — Encounter
Admission: RE | Disposition: A | Discharge status: Home / Self Care | Payer: Self-pay | Source: Ambulatory Visit | Attending: Gastroenterology

## 2015-11-19 ENCOUNTER — Ambulatory Visit: Payer: BLUE CROSS/BLUE SHIELD | Admitting: Anesthesiology

## 2015-11-19 ENCOUNTER — Ambulatory Visit
Admission: RE | Admit: 2015-11-19 | Discharge: 2015-11-19 | Disposition: A | Discharge status: Home / Self Care | Payer: BLUE CROSS/BLUE SHIELD | Source: Ambulatory Visit | Attending: Gastroenterology | Admitting: Gastroenterology

## 2015-11-19 ENCOUNTER — Encounter: Payer: Self-pay | Admitting: Anesthesiology

## 2015-11-19 DIAGNOSIS — K219 Gastro-esophageal reflux disease without esophagitis: Secondary | ICD-10-CM | POA: Diagnosis not present

## 2015-11-19 DIAGNOSIS — K2960 Other gastritis without bleeding: Secondary | ICD-10-CM | POA: Diagnosis not present

## 2015-11-19 DIAGNOSIS — R1084 Generalized abdominal pain: Secondary | ICD-10-CM | POA: Diagnosis present

## 2015-11-19 DIAGNOSIS — F17210 Nicotine dependence, cigarettes, uncomplicated: Secondary | ICD-10-CM | POA: Insufficient documentation

## 2015-11-19 DIAGNOSIS — K319 Disease of stomach and duodenum, unspecified: Secondary | ICD-10-CM | POA: Insufficient documentation

## 2015-11-19 DIAGNOSIS — K449 Diaphragmatic hernia without obstruction or gangrene: Secondary | ICD-10-CM | POA: Insufficient documentation

## 2015-11-19 DIAGNOSIS — R112 Nausea with vomiting, unspecified: Secondary | ICD-10-CM | POA: Diagnosis not present

## 2015-11-19 HISTORY — DX: Low back pain, unspecified: M54.50

## 2015-11-19 HISTORY — DX: Low back pain: M54.5

## 2015-11-19 HISTORY — PX: ESOPHAGOGASTRODUODENOSCOPY (EGD) WITH PROPOFOL: SHX5813

## 2015-11-19 LAB — POTASSIUM: Potassium: 3.4 mmol/L — ABNORMAL LOW (ref 3.5–5.1)

## 2015-11-19 SURGERY — ESOPHAGOGASTRODUODENOSCOPY (EGD) WITH PROPOFOL
Anesthesia: General

## 2015-11-19 MED ORDER — SODIUM CHLORIDE 0.9 % IV SOLN
INTRAVENOUS | Status: DC
  Administered 2015-11-19: 1000 mL via INTRAVENOUS
  Administered 2015-11-19: 13:00:00 via INTRAVENOUS

## 2015-11-19 MED ORDER — MIDAZOLAM HCL 2 MG/2ML IJ SOLN
INTRAMUSCULAR | Status: DC | PRN
  Administered 2015-11-19: 1 mg via INTRAVENOUS

## 2015-11-19 MED ORDER — FENTANYL CITRATE (PF) 100 MCG/2ML IJ SOLN
INTRAMUSCULAR | Status: DC | PRN
  Administered 2015-11-19: 50 ug via INTRAVENOUS

## 2015-11-19 MED ORDER — PROPOFOL 500 MG/50ML IV EMUL
INTRAVENOUS | Status: DC | PRN
  Administered 2015-11-19: 150 ug/kg/min via INTRAVENOUS

## 2015-11-19 MED ORDER — PROPOFOL 10 MG/ML IV BOLUS
INTRAVENOUS | Status: DC | PRN
  Administered 2015-11-19: 30 mg via INTRAVENOUS
  Administered 2015-11-19: 40 mg via INTRAVENOUS

## 2015-11-19 NOTE — Anesthesia Preprocedure Evaluation (Signed)
Anesthesia Evaluation  Patient identified by MRN, date of birth, ID band Patient awake    Reviewed: Allergy & Precautions, NPO status , Patient's Chart, lab work & pertinent test results, reviewed documented beta blocker date and time   Airway Mallampati: II  TM Distance: >3 FB     Dental  (+) Chipped   Pulmonary Current Smoker,           Cardiovascular      Neuro/Psych    GI/Hepatic   Endo/Other    Renal/GU      Musculoskeletal   Abdominal   Peds  Hematology   Anesthesia Other Findings   Reproductive/Obstetrics                             Anesthesia Physical Anesthesia Plan  ASA: II  Anesthesia Plan: General   Post-op Pain Management:    Induction: Intravenous  Airway Management Planned: Nasal Cannula  Additional Equipment:   Intra-op Plan:   Post-operative Plan:   Informed Consent: I have reviewed the patients History and Physical, chart, labs and discussed the procedure including the risks, benefits and alternatives for the proposed anesthesia with the patient or authorized representative who has indicated his/her understanding and acceptance.     Plan Discussed with: CRNA  Anesthesia Plan Comments:         Anesthesia Quick Evaluation  

## 2015-11-19 NOTE — Transfer of Care (Signed)
Immediate Anesthesia Transfer of Care Note  Patient: Janet Kemp  Procedure(s) Performed: Procedure(s): ESOPHAGOGASTRODUODENOSCOPY (EGD) WITH PROPOFOL (N/A)  Patient Location: Endoscopy Unit  Anesthesia Type:General  Level of Consciousness: sedated  Airway & Oxygen Therapy: Patient Spontanous Breathing and Patient connected to nasal cannula oxygen  Post-op Assessment: Report given to RN and Post -op Vital signs reviewed and stable  Post vital signs: Reviewed and stable  Last Vitals:  Filed Vitals:   11/19/15 1225  BP: 106/62  Pulse: 76  Temp: 36.5 C  Resp: 20    Complications: No apparent anesthesia complications

## 2015-11-19 NOTE — Anesthesia Procedure Notes (Signed)
Date/Time: 11/19/2015 1:11 PM Performed by: Junious SilkNOLES, Chablis Losh Pre-anesthesia Checklist: Patient identified, Emergency Drugs available, Suction available, Patient being monitored and Timeout performed Oxygen Delivery Method: Nasal cannula

## 2015-11-19 NOTE — Op Note (Signed)
[facility] Gastroenterology Patient Name: Janet Kemp Procedure Date: 11/19/2015 1:01 PM MRN: [id number] Account #: [id number] Date of Birth: [birth year] Admit Type: Outpatient Age: [age] [facility] ENDO ROOM 2 Gender: Female Note Status: Finalized Procedure:            Upper GI endoscopy Indications:          Generalized abdominal pain, Nausea with vomiting Patient Profile:      This is a 50 year old female. Providers:            Janet RaiderMatthew G. Shelle Ironein, MD Referring MD:         Janet IvanMeindert A. Lacie ScottsNiemeyer, MD (Referring MD) Medicines:            Propofol per Anesthesia Complications:        No immediate complications. Estimated blood loss:                        Minimal. Procedure:            Pre-Anesthesia Assessment:                       - Prior to the procedure, a History and Physical was                        performed, and patient medications, allergies and                        sensitivities were reviewed. The patient's tolerance of                        previous anesthesia was reviewed.                       After obtaining informed consent, the endoscope was                        passed under direct vision. Throughout the procedure,                        the patient's blood pressure, pulse, and oxygen                        saturations were monitored continuously. The Endoscope                        was introduced through the mouth, and advanced to the                        second part of duodenum. The upper GI endoscopy was                        accomplished without difficulty. The patient tolerated                        the procedure well. Findings:      A small hiatal hernia was present. Esophagus otherwise normal.      Multiple dispersed 2 to 4 mm erosions were found in the gastric body and       in the gastric antrum. There were no stigmata of recent bleeding.       Biopsies were taken with a cold  forceps for histology.      The examined duodenum was  normal. Impression:           - Small hiatal hernia.                       - Erosive gastropathy. Biopsied.                       - Normal examined duodenum. Recommendation:       - Observe patient in GI recovery unit.                       - Resume regular diet.                       - Continue present medications.                       - Await pathology results.                       - Avoid NSAIDS.                       - Use omeprazole  daily, take 30 minutes before                        breakfast                       - Obtain CT a/p and CT head for am nausea/vomiting.                       - The findings and recommendations were discussed with                        the patient.                       - The findings and recommendations were discussed with                        the patient's family. Procedure Code(s):    --- Professional ---                       [phone], Esophagogastroduodenoscopy, flexible, transoral;                        with biopsy, single or multiple Diagnosis Code(s):    --- Professional ---                       K44.9, Diaphragmatic hernia without obstruction or                        gangrene                       K31.89, Other diseases of stomach and duodenum                       R10.84, Generalized abdominal pain                       R11.2, Nausea with vomiting, unspecified  CPT copyright 2016 [employer]. All rights reserved. The codes documented in this report are preliminary and upon coder review may  be revised to meet current compliance requirements. Janet Frames, MD 11/19/2015 1:32:08 PM This report has been signed electronically. Number of Addenda: 0 Note Initiated On: 11/19/2015 1:01 PM      [facility]

## 2015-11-19 NOTE — Anesthesia Postprocedure Evaluation (Signed)
Anesthesia Post Note  Patient: Janet CannerLeslie J Kemp  Procedure(Kemp) Performed: Procedure(Kemp) (LRB): ESOPHAGOGASTRODUODENOSCOPY (EGD) WITH PROPOFOL (N/A)  Patient location during evaluation: Endoscopy Anesthesia Type: General Level of consciousness: awake and alert Pain management: pain level controlled Vital Signs Assessment: post-procedure vital signs reviewed and stable Respiratory status: spontaneous breathing, nonlabored ventilation, respiratory function stable and patient connected to nasal cannula oxygen Cardiovascular status: blood pressure returned to baseline and stable Postop Assessment: no signs of nausea or vomiting Anesthetic complications: no    Last Vitals:  Filed Vitals:   11/19/15 1324 11/19/15 1334  BP: 144/90 129/79  Pulse: 65 60  Temp: 35.8 C   Resp: 10 19    Last Pain: There were no vitals filed for this visit.               Janet Kemp

## 2015-11-19 NOTE — H&P (Signed)
  Primary Care Physician:  Evelene CroonNIEMEYER, MEINDERT, MD  Pre-Procedure History & Physical: HPI:  Janet Kemp is a 50 y.o. female is here for an endoscopy.   Past Medical History  Diagnosis Date  . Menopause   . Back pain   . Lumbago     Past Surgical History  Procedure Laterality Date  . Cholecystectomy    . Abdominal hysterectomy      Prior to Admission medications   Medication Sig Start Date End Date Taking? Authorizing Provider  omeprazole (PRILOSEC) 40 MG capsule Take 40 mg by mouth daily.   Yes Historical Provider, MD  clindamycin (CLEOCIN) 300 MG capsule Take 1 capsule (300 mg total) by mouth 4 (four) times daily. 05/21/15   Jenise V Bacon Menshew, PA-C  estradiol (ESTRACE) 0.5 MG tablet Take 0.5 mg by mouth daily.    Historical Provider, MD  HYDROcodone-acetaminophen (NORCO) 5-325 MG per tablet Take 1 tablet by mouth every 8 (eight) hours as needed for moderate pain. 05/21/15   Jenise V Bacon Menshew, PA-C  ibuprofen (ADVIL,MOTRIN) 200 MG tablet Take 200-800 mg by mouth every 6 (six) hours as needed (pain).    Historical Provider, MD  naproxen (NAPROSYN) 500 MG tablet Take 1 tablet (500 mg total) by mouth 2 (two) times daily. 03/08/14   Tatyana Kirichenko, PA-C  traMADol (ULTRAM) 50 MG tablet Take 1 tablet (50 mg total) by mouth every 6 (six) hours as needed. 03/08/14   Tatyana Kirichenko, PA-C    Allergies as of 11/13/2015 - Review Complete 05/21/2015  Allergen Reaction Noted  . Codeine Itching 03/13/2012    History reviewed. No pertinent family history.  Social History   Social History  . Marital Status: Widowed    Spouse Name: N/A  . Number of Children: N/A  . Years of Education: N/A   Occupational History  . Not on file.   Social History Main Topics  . Smoking status: Current Every Day Smoker  . Smokeless tobacco: Not on file  . Alcohol Use: No  . Drug Use: No  . Sexual Activity: No   Other Topics Concern  . Not on file   Social History Narrative      Physical Exam: BP 106/62 mmHg  Pulse 76  Temp(Src) 97.7 F (36.5 C) (Tympanic)  Resp 20  Ht 5\' 3"  (1.6 m)  Wt 68.04 kg (150 lb)  BMI 26.58 kg/m2 General:   Alert,  pleasant and cooperative in NAD Head:  Normocephalic and atraumatic. Neck:  Supple; no masses or thyromegaly. Lungs:  Clear throughout to auscultation.    Heart:  Regular rate and rhythm. Abdomen:  Soft, nontender and nondistended. Normal bowel sounds, without guarding, and without rebound.   Neurologic:  Alert and  oriented x4;  grossly normal neurologically.  Impression/Plan: Janet GarbeLeslie J Lupu is here for an endoscopy to be performed for n/v, GERD, abd pain  Risks, benefits, limitations, and alternatives regarding  endoscopy have been reviewed with the patient.  Questions have been answered.  All parties agreeable.   Elnita MaxwellEIN, Dechelle Attaway GORDON, MD  11/19/2015, 1:00 PM

## 2015-11-19 NOTE — Discharge Instructions (Signed)

## 2015-11-20 LAB — SURGICAL PATHOLOGY

## 2015-11-29 ENCOUNTER — Other Ambulatory Visit: Payer: Self-pay | Admitting: Gastroenterology

## 2015-11-29 DIAGNOSIS — R634 Abnormal weight loss: Secondary | ICD-10-CM

## 2015-11-29 DIAGNOSIS — R112 Nausea with vomiting, unspecified: Secondary | ICD-10-CM

## 2015-11-30 ENCOUNTER — Ambulatory Visit: Payer: BLUE CROSS/BLUE SHIELD

## 2015-11-30 ENCOUNTER — Ambulatory Visit
Admit: 2015-11-30 | Discharge: 2015-11-30 | Disposition: A | Discharge status: Home / Self Care | Payer: BLUE CROSS/BLUE SHIELD | Attending: Gastroenterology | Admitting: Gastroenterology

## 2015-11-30 DIAGNOSIS — R634 Abnormal weight loss: Secondary | ICD-10-CM | POA: Diagnosis present

## 2015-11-30 DIAGNOSIS — R112 Nausea with vomiting, unspecified: Secondary | ICD-10-CM | POA: Insufficient documentation

## 2015-11-30 DIAGNOSIS — J439 Emphysema, unspecified: Secondary | ICD-10-CM | POA: Diagnosis not present

## 2015-11-30 DIAGNOSIS — I700 Atherosclerosis of aorta: Secondary | ICD-10-CM | POA: Diagnosis not present

## 2015-11-30 DIAGNOSIS — K5900 Constipation, unspecified: Secondary | ICD-10-CM | POA: Diagnosis not present

## 2015-11-30 HISTORY — DX: Unspecified asthma, uncomplicated: J45.909

## 2015-11-30 HISTORY — DX: Systemic involvement of connective tissue, unspecified: M35.9

## 2015-11-30 MED ORDER — IOHEXOL 300 MG/ML  SOLN
100.0000 mL | Freq: Once | INTRAMUSCULAR | Status: AC | PRN
  Administered 2015-11-30: 100 mL via INTRAVENOUS

## 2016-10-20 ENCOUNTER — Emergency Department
Admission: EM | Admit: 2016-10-20 | Discharge: 2016-10-20 | Disposition: A | Discharge status: Home / Self Care | Payer: BLUE CROSS/BLUE SHIELD | Attending: Emergency Medicine | Admitting: Emergency Medicine

## 2016-10-20 DIAGNOSIS — R112 Nausea with vomiting, unspecified: Secondary | ICD-10-CM | POA: Insufficient documentation

## 2016-10-20 DIAGNOSIS — R05 Cough: Secondary | ICD-10-CM | POA: Diagnosis not present

## 2016-10-20 DIAGNOSIS — J029 Acute pharyngitis, unspecified: Secondary | ICD-10-CM | POA: Diagnosis not present

## 2016-10-20 DIAGNOSIS — Z79899 Other long term (current) drug therapy: Secondary | ICD-10-CM | POA: Insufficient documentation

## 2016-10-20 DIAGNOSIS — R509 Fever, unspecified: Secondary | ICD-10-CM | POA: Insufficient documentation

## 2016-10-20 DIAGNOSIS — J45909 Unspecified asthma, uncomplicated: Secondary | ICD-10-CM | POA: Insufficient documentation

## 2016-10-20 DIAGNOSIS — R6889 Other general symptoms and signs: Secondary | ICD-10-CM

## 2016-10-20 DIAGNOSIS — F17200 Nicotine dependence, unspecified, uncomplicated: Secondary | ICD-10-CM | POA: Insufficient documentation

## 2016-10-20 DIAGNOSIS — R51 Headache: Secondary | ICD-10-CM | POA: Insufficient documentation

## 2016-10-20 DIAGNOSIS — R197 Diarrhea, unspecified: Secondary | ICD-10-CM | POA: Insufficient documentation

## 2016-10-20 MED ORDER — ONDANSETRON 4 MG PO TBDP: 4.0000 mg | ORAL_TABLET | Freq: Three times a day (TID) | ORAL | 0 refills | Status: DC | PRN

## 2016-10-20 MED ORDER — ONDANSETRON HCL 4 MG/2ML IJ SOLN
4.0000 mg | Freq: Once | INTRAMUSCULAR | Status: AC
  Administered 2016-10-20: 4 mg via INTRAVENOUS
  Filled 2016-10-20: qty 2

## 2016-10-20 MED ORDER — NAPROXEN 500 MG PO TABS: 500.0000 mg | ORAL_TABLET | Freq: Two times a day (BID) | ORAL | 0 refills | Status: DC

## 2016-10-20 MED ORDER — HYDROCOD POLST-CPM POLST ER 10-8 MG/5ML PO SUER: 5.0000 mL | Freq: Two times a day (BID) | ORAL | 0 refills | Status: DC

## 2016-10-20 MED ORDER — SODIUM CHLORIDE 0.9 % IV BOLUS (SEPSIS)
1000.0000 mL | Freq: Once | INTRAVENOUS | Status: AC
  Administered 2016-10-20: 1000 mL via INTRAVENOUS

## 2016-10-20 MED ORDER — KETOROLAC TROMETHAMINE 30 MG/ML IJ SOLN
15.0000 mg | Freq: Once | INTRAMUSCULAR | Status: AC
  Administered 2016-10-20: 15 mg via INTRAVENOUS
  Filled 2016-10-20: qty 1

## 2016-10-20 NOTE — ED Provider Notes (Signed)
[facility] Emergency Department Provider Note  ____________________________________________  Time seen: Approximately 8:39 PM  I have reviewed the triage vital signs and the nursing notes.   HISTORY  Chief Complaint Influenza   HPI Janet Kemp is a 51 y.o. female who presents to the emergency department for evaluation of headache, bilateral earache, generalized body aches, dry cough with runny nose and nasal congestion. Symptoms started 2 days ago. She is had multiple episodes of vomiting and diarrhea. She has been unable to keep fluids down this evening. She has taken TheraFlu, Tylenol, ibuprofen, and fixed vapor rub without relief.   Past Medical History:  Diagnosis Date  . Asthma   . Back pain   . Collagen vascular disease (HCC)   . Lumbago   . Menopause     There are no active problems to display for this patient.   Past Surgical History:  Procedure Laterality Date  . ABDOMINAL HYSTERECTOMY    . CHOLECYSTECTOMY    . ESOPHAGOGASTRODUODENOSCOPY (EGD) WITH PROPOFOL N/A 11/19/2015   Procedure: ESOPHAGOGASTRODUODENOSCOPY (EGD) WITH PROPOFOL;  Surgeon: Elnita Maxwell, MD;  Location: [facility] END[facility]COPY;  Service: Endoscopy;  Laterality: N/A;    Prior to Admission medications   Medication Sig Start Date End Date Taking? Authorizing Provider  chlorpheniramine-HYDROcodone (TUSSIONEX PENNKINETIC ER) 10-8 MG/5ML SUER Take 5 mLs by mouth 2 (two) times daily. 10/20/16   Chinita Pester, FNP  clindamycin (CLEOCIN) 300 MG capsule Take 1 capsule (300 mg total) by mouth 4 (four) times daily. 05/21/15   Jenise V Bacon Menshew, PA-C  estradiol (ESTRACE) 0.5 MG tablet Take 0.5 mg by mouth daily.    Historical Provider, MD  HYDROcodone-acetaminophen (NORCO) 5-325 MG per tablet Take 1 tablet by mouth every 8 (eight) hours as needed for moderate pain. 05/21/15   Jenise V Bacon Menshew, PA-C  ibuprofen (ADVIL,MOTRIN) 200 MG tablet Take 200-800 mg by mouth every 6 (six)  hours as needed (pain).    Historical Provider, MD  naproxen (NAPROSYN) 500 MG tablet Take 1 tablet (500 mg total) by mouth 2 (two) times daily with a meal. 10/20/16   Raynald Rouillard B Takesha Steger, FNP  omeprazole (PRILOSEC) 40 MG capsule Take 40 mg by mouth daily.    Historical Provider, MD  ondansetron (ZOFRAN-ODT) 4 MG disintegrating tablet Take 1 tablet (4 mg total) by mouth every 8 (eight) hours as needed for nausea or vomiting. 10/20/16   Chinita Pester, FNP  traMADol (ULTRAM) 50 MG tablet Take 1 tablet (50 mg total) by mouth every 6 (six) hours as needed. 03/08/14   Tatyana Kirichenko, PA-C    Allergies Codeine  No family history on file.  Social History Social History  Substance Use Topics  . Smoking status: Current Every Day Smoker  . Smokeless tobacco: Never Used  . Alcohol use No    Review of Systems Constitutional: Positive for fever/chills ENT: Positive for sore throat. Cardiovascular: Denies chest pain. Respiratory: Negative for shortness of breath. Positive for cough. Gastrointestinal: Positive for nausea,  positive vomiting.  Positive for diarrhea.  Musculoskeletal: Positive for body aches Skin: Negative for rash. Neurological: Positive for headaches ____________________________________________   PHYSICAL EXAM:  VITAL SIGNS: ED Triage Vitals  Enc Vitals Group     BP 10/20/16 2002 109/82     Pulse Rate 10/20/16 2002 85     Resp 10/20/16 2002 15     Temp 10/20/16 2002 98.4 F (36.9 C)     Temp Source 10/20/16 2002 Oral  SpO2 10/20/16 2002 98 %     Weight 10/20/16 2003 155 lb (70.3 kg)     Height 10/20/16 2003 5\' 3"  (1.6 m)     Head Circumference --      Peak Flow --      Pain Score 10/20/16 2003 7     Pain Loc --      Pain Edu? --      Excl. in GC? --     Constitutional: Alert and oriented. Acutely ill appearing and in no acute distress. Eyes: Conjunctivae are normal. EOMI. Ears: Bilateral tympanic membranes mildly injected but without loss of light  reflex Nose: No congestion; clear rhinnorhea. Mouth/Throat: Mucous membranes are moist.  Oropharynx nonerythematous. Tonsils not visualized. Neck: No stridor.  Lymphatic: No cervical lymphadenopathy. Cardiovascular: Normal rate, regular rhythm. Grossly normal heart sounds.  Good peripheral circulation. Respiratory: Normal respiratory effort.  No retractions. Clear to auscultation throughout. Gastrointestinal: Soft and nontender.  Musculoskeletal: FROM x 4 extremities.  Neurologic:  Normal speech and language.  Skin:  Skin is warm, dry and intact. No rash noted. Psychiatric: Mood and affect are normal. Speech and behavior are normal.  ____________________________________________   LABS (all labs ordered are listed, but only abnormal results are displayed)  Labs Reviewed - No data to display ____________________________________________  EKG   ____________________________________________  RADIOLOGY  Not indicated ____________________________________________   PROCEDURES  Procedure(s) performed: None  Critical Care performed: No  ____________________________________________   INITIAL IMPRESSION / ASSESSMENT AND PLAN / ED COURSE     Pertinent labs & imaging results that were available during my care of the patient were reviewed by me and considered in my medical decision making (see chart for details).   51 year old female presenting to the emergency department for influenza-like illness. She will be given 1 L of IV fluids, Zofran, and Toradol while in the emergency department due to 2 inability to tolerate fluids this evening and plate of headache and body aches.  ----------------------------------------- 10:51 PM on 10/20/2016 -----------------------------------------  Plan discharge patient home after IV fluids complete. She is had no additional vomiting and denies additional episodes of diarrhea since she has been in the emergency department. She reports  resolution of her nausea and decrease in headache. She will be discharged home with Zofran, Naprosyn, and Tussionex. She was advised follow-up with her primary care provider for symptoms that are not improving over the next few days or return to the emergency department for symptoms that change or worsen and she is unable schedule an appointment. ____________________________________________   FINAL CLINICAL IMPRESSION(S) / ED DIAGNOSES  Final diagnoses:  Flu-like symptoms    Note:  This document was prepared using Dragon voice recognition software and may include unintentional dictation errors.     Chinita PesterCari B Oaklen Thiam, FNP 10/20/16 2252    Phineas SemenGraydon Goodman, MD 10/20/16 2253

## 2016-10-20 NOTE — Discharge Instructions (Signed)
Follow up with your primary care provider for symptoms that are not improving over the next few days.  Return to the ER for symptoms that change or worsen if unable to schedule an appointment. 

## 2016-10-20 NOTE — ED Triage Notes (Signed)
Pt c/o severe headache, bilat ear pain, generalized body aches and dry cough with runny nose and nasal congestion

## 2016-10-20 NOTE — ED Notes (Signed)
Pt discharged to home.  Family member driving.  Discharge instructions reviewed.  Verbalized understanding.  No questions or concerns at this time.  Teach back verified.  Pt in NAD.  No items left in ED.   

## 2017-01-06 ENCOUNTER — Inpatient Hospital Stay (HOSPITAL_COMMUNITY)
Admission: AD | Admit: 2017-01-06 | Discharge: 2017-01-06 | Disposition: A | Discharge status: Home / Self Care | Payer: BLUE CROSS/BLUE SHIELD | Source: Ambulatory Visit | Attending: Family Medicine | Admitting: Family Medicine

## 2017-01-06 DIAGNOSIS — M359 Systemic involvement of connective tissue, unspecified: Secondary | ICD-10-CM | POA: Insufficient documentation

## 2017-01-06 DIAGNOSIS — I10 Essential (primary) hypertension: Secondary | ICD-10-CM | POA: Diagnosis not present

## 2017-01-06 DIAGNOSIS — G43909 Migraine, unspecified, not intractable, without status migrainosus: Secondary | ICD-10-CM | POA: Insufficient documentation

## 2017-01-06 DIAGNOSIS — Z90710 Acquired absence of both cervix and uterus: Secondary | ICD-10-CM | POA: Insufficient documentation

## 2017-01-06 DIAGNOSIS — F17200 Nicotine dependence, unspecified, uncomplicated: Secondary | ICD-10-CM | POA: Insufficient documentation

## 2017-01-06 DIAGNOSIS — Z885 Allergy status to narcotic agent status: Secondary | ICD-10-CM | POA: Insufficient documentation

## 2017-01-06 DIAGNOSIS — Z9049 Acquired absence of other specified parts of digestive tract: Secondary | ICD-10-CM | POA: Diagnosis not present

## 2017-01-06 MED ORDER — PROMETHAZINE HCL 25 MG/ML IJ SOLN
25.0000 mg | Freq: Once | INTRAMUSCULAR | Status: AC
  Administered 2017-01-06: 25 mg via INTRAMUSCULAR
  Filled 2017-01-06: qty 1

## 2017-01-06 MED ORDER — KETOROLAC TROMETHAMINE 60 MG/2ML IM SOLN
60.0000 mg | INTRAMUSCULAR | Status: AC
  Administered 2017-01-06: 60 mg via INTRAMUSCULAR
  Filled 2017-01-06: qty 2
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
## 2017-04-13 ENCOUNTER — Ambulatory Visit (INDEPENDENT_AMBULATORY_CARE_PROVIDER_SITE_OTHER): Payer: BLUE CROSS/BLUE SHIELD | Admitting: Obstetrics and Gynecology

## 2017-04-13 ENCOUNTER — Encounter: Payer: Self-pay | Admitting: Obstetrics and Gynecology

## 2017-04-13 VITALS — BP 120/80 | HR 75 | Ht 63.0 in | Wt 155.0 lb

## 2017-04-13 DIAGNOSIS — Z113 Encounter for screening for infections with a predominantly sexual mode of transmission: Secondary | ICD-10-CM

## 2017-04-13 DIAGNOSIS — Z01419 Encounter for gynecological examination (general) (routine) without abnormal findings: Secondary | ICD-10-CM

## 2017-04-13 DIAGNOSIS — N952 Postmenopausal atrophic vaginitis: Secondary | ICD-10-CM

## 2017-04-13 DIAGNOSIS — Z01411 Encounter for gynecological examination (general) (routine) with abnormal findings: Secondary | ICD-10-CM

## 2017-04-13 DIAGNOSIS — N941 Unspecified dyspareunia: Secondary | ICD-10-CM | POA: Insufficient documentation

## 2017-04-13 DIAGNOSIS — N6002 Solitary cyst of left breast: Secondary | ICD-10-CM | POA: Insufficient documentation

## 2017-04-13 MED ORDER — FLUCONAZOLE 150 MG PO TABS: 150.0000 mg | ORAL_TABLET | Freq: Once | ORAL | 2 refills | Status: AC

## 2017-04-13 MED ORDER — ESTRADIOL 2 MG VA RING: 2.0000 mg | VAGINAL_RING | VAGINAL | 3 refills | Status: DC

## 2017-04-13 NOTE — Progress Notes (Signed)
Obstetrics and Gynecology Annual Patient Evaluation  Appointment Date: 04/13/2017  [facility]  Primary [facility] Provider: [facility]  Referring Provider: No ref. provider found  Chief Complaint:  Chief Complaint  Patient presents with  . Gynecologic Exam  . Dyspareunia    History of Present Illness: Janet Kemp is a 51 y.o. Caucasian (No LMP recorded. Patient has had a hysterectomy.), seen for the above chief complaint.   Patient has recently become sexually active again after several years and notes pain with intercourse with insertion and during intercourse  No unchanged breast s/s, fevers, chills, chest pain, SOB, nausea, vomiting, abdominal pain, dysuria, hematuria, vaginal itching, diarrhea, constipation, blood in BMs  Review of Systems: as noted in the History of Present Illness.  Past Medical History:  Past Medical History:  Diagnosis Date  . Asthma   . Back pain   . Collagen vascular disease (HCC)   . Hypertension   . Lumbago   . Menopause     Past Surgical History:  Past Surgical History:  Procedure Laterality Date  . CHOLECYSTECTOMY    . ESOPHAGOGASTRODUODENOSCOPY (EGD) WITH PROPOFOL N/A 11/19/2015   Procedure: ESOPHAGOGASTRODUODENOSCOPY (EGD) WITH PROPOFOL;  Surgeon: Elnita MaxwellMatthew Gordon Rein, MD;  [facility];  Service: Endoscopy;  Laterality: N/A;  . LAPAROSCOPIC ASSISTED VAGINAL HYSTERECTOMY  2007    Past Gynecological History: As per HPI. No bleeding or spotting since her hysterectomy No history of abnormal paps HRT: patient states she took PO HRT after her hyst but only briefly and stopped b/c it made her feel bloated  Social History:  Social History   Social History  . Marital status: Widowed    Spouse name: N/A  . Number of children: N/A  . Years of education: N/A   Occupational History  . Not on file.   Social History Main Topics  . Smoking status: Current Every Day  Smoker    Packs/day: 2.00    Years: 34.00    Types: Cigarettes  . Smokeless tobacco: Never Used  . Alcohol use No  . Drug use: No  . Sexual activity: No   Other Topics Concern  . Not on file   Social History Narrative  . No narrative on file    Family History:  See scanned sheet in "media"  Health Maintenance:  [facility]  Medications Ms. Harrower had no medications administered during this visit. Current Outpatient Prescriptions  Medication Sig Dispense Refill  . ALPRAZolam (XANAX) 1 MG tablet Take 1 mg by mouth 2 (two) times daily.  2  . baclofen (LIORESAL) 10 MG tablet Take by mouth.    . butalbital-acetaminophen-caffeine (FIORICET, ESGIC) 50-325-40 MG tablet Take 1 tablet by mouth 3 (three) times daily as needed.  5  . ibuprofen (ADVIL,MOTRIN) 200 MG tablet Take 200-800 mg by mouth every 6 (six) hours as needed (pain).    Marland Kitchen. morphine (MS CONTIN) 30 MG 12 hr tablet Take by mouth.    Marland Kitchen. omeprazole (PRILOSEC) 40 MG capsule Take 40 mg by mouth daily.    . ondansetron (ZOFRAN-ODT) 4 MG disintegrating tablet Take 1 tablet (4 mg total) by mouth every 8 (eight) hours as needed for nausea or vomiting. 20 tablet 0  . oxyCODONE-acetaminophen (PERCOCET) 10-325 MG tablet Take by mouth.    . Vitamin D, Ergocalciferol, (DRISDOL) 50000 units CAPS capsule Take by mouth.     No current facility-administered medications for this visit.     Allergies Cymbalta [duloxetine  hcl] and Codeine   Physical Exam:  BP 120/80   Pulse 75   Ht 5\' 3"  (1.6 m)   Wt 155 lb (70.3 kg)   BMI 27.46 kg/m  Body mass index is 27.46 kg/m.  General appearance: Well nourished, well developed female in no acute distress.  Neck:  Supple, normal appearance, and no thyromegaly  Cardiovascular: normal s1 and s2.  No murmurs, rubs or gallops. Respiratory:  Clear to auscultation bilateral. Normal respiratory effort Abdomen: positive bowel sounds and no masses, hernias; diffusely non tender to  palpation, non distended Breasts: normal inspection and palpation of left breast and axilla, and also negative left breast exam except for 1.5 x 1.5cm cyst in inner left breast at approx the 8 o'clock position, in relation to the nipple and adjacent to the sternum. The area is nttp, slightly fluctuant and no overlying skin changes.  Neuro/Psych:  Normal mood and affect.  Skin:  Warm and dry.  Lymphatic:  No inguinal lymphadenopathy.   Pelvic exam: is not limited by body habitus EGBUS: +slight white cottage cheese d/c at introitus, mildly atrophic Vagina: loss of rugae, no blood or discharge. Feels shortened.  Cuff: nttp, well healed Adnexa: negative Rectovaginal: deferred  Laboratory: none  Radiology: none  Assessment: pt stable  Plan:  1. Dyspareunia in female D/w her recommend vaginal estrogen. Theoretical risks of estrogen d/w her. Pt amenable to HRT. Will try e-string. Pt to call and let us know if this is expensive and can try another route - Cervicovaginal ancillary only - HIV antibody (with reflex) - RPR - Hepatitis B Surface AntiGEN - Hepatitis C Antibody  2. Atrophic vulvovaginitis diflucan  3. Encounter for gynecological examination (general) (routine) with abnormal findings See above. Recommend no need for paps given total hyst and no abnormal paps in the past  4. Left breast cyst D/w pt that I recommend continued imaging and surveillance of left breast cyst but patient declines.   Orders Placed This Encounter  Procedures  . HIV antibody (with reflex)  . RPR  . Hepatitis B Surface AntiGEN  . Hepatitis C Antibody    RTC 49m  Cornelia Copaharlie Ekin Pilar, Jr MD Attending [facility](Faculty Practice)   [place] [facility]

## 2017-04-14 LAB — CERVICOVAGINAL ANCILLARY ONLY
CHLAMYDIA, DNA PROBE: NEGATIVE
Neisseria Gonorrhea: NEGATIVE
TRICH (WINDOWPATH): POSITIVE — AB

## 2017-04-15 ENCOUNTER — Encounter: Payer: Self-pay | Admitting: Obstetrics and Gynecology

## 2017-04-15 ENCOUNTER — Telehealth: Payer: Self-pay | Admitting: *Deleted

## 2017-04-15 DIAGNOSIS — A599 Trichomoniasis, unspecified: Secondary | ICD-10-CM | POA: Insufficient documentation

## 2017-04-15 LAB — HIV ANTIBODY (ROUTINE TESTING W REFLEX): HIV SCREEN 4TH GENERATION: NONREACTIVE

## 2017-04-15 LAB — RPR: RPR: NONREACTIVE

## 2017-04-15 LAB — HEPATITIS C ANTIBODY: Hep C Virus Ab: <0.1 s/co ratio (ref 0.0–0.9)

## 2017-04-15 LAB — HEPATITIS B SURFACE ANTIGEN: HEP B S AG: NEGATIVE

## 2017-04-15 MED ORDER — METRONIDAZOLE 500 MG PO TABS: ORAL_TABLET | ORAL | 0 refills | Status: DC

## 2017-04-15 NOTE — Telephone Encounter (Signed)
-----   Message from [place] Bingharlie Pickens, MD sent at 04/15/2017  9:22 AM EDT ----- Please call and treat her with flagyl 2gm po x 1. thanks

## 2017-04-15 NOTE — Telephone Encounter (Signed)
LM for pt to rtn call for results - sent meds per protocol

## 2017-04-16 ENCOUNTER — Telehealth: Payer: Self-pay

## 2017-04-16 DIAGNOSIS — A599 Trichomoniasis, unspecified: Secondary | ICD-10-CM

## 2017-04-16 MED ORDER — METRONIDAZOLE 500 MG PO TABS: ORAL_TABLET | ORAL | 0 refills | Status: DC
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
## 2017-04-20 ENCOUNTER — Other Ambulatory Visit: Payer: Self-pay | Admitting: Obstetrics and Gynecology

## 2017-04-20 ENCOUNTER — Telehealth: Payer: Self-pay

## 2017-04-20 MED ORDER — ESTRADIOL 0.1 MG/GM VA CREA: TOPICAL_CREAM | VAGINAL | 12 refills | Status: DC

## 2017-04-20 NOTE — Telephone Encounter (Signed)
Left message informing patient of medication.

## 2017-04-20 NOTE — Telephone Encounter (Signed)
Patient would like to try the vaginal cream instead of the vaginal ring.  The ring needed prior authorization.  Please advise.

## 2017-04-20 NOTE — Telephone Encounter (Signed)
Please let her know that I sent in some vaginal cream instead. thanks

## 2017-08-08 ENCOUNTER — Other Ambulatory Visit: Payer: Self-pay

## 2017-08-08 ENCOUNTER — Emergency Department: Payer: BLUE CROSS/BLUE SHIELD

## 2017-08-08 ENCOUNTER — Encounter: Payer: Self-pay | Admitting: Emergency Medicine

## 2017-08-08 ENCOUNTER — Emergency Department
Admission: EM | Admit: 2017-08-08 | Discharge: 2017-08-08 | Disposition: A | Discharge status: Home / Self Care | Payer: BLUE CROSS/BLUE SHIELD | Attending: Emergency Medicine | Admitting: Emergency Medicine

## 2017-08-08 DIAGNOSIS — R112 Nausea with vomiting, unspecified: Secondary | ICD-10-CM | POA: Diagnosis present

## 2017-08-08 DIAGNOSIS — R05 Cough: Secondary | ICD-10-CM | POA: Insufficient documentation

## 2017-08-08 DIAGNOSIS — M791 Myalgia, unspecified site: Secondary | ICD-10-CM | POA: Diagnosis not present

## 2017-08-08 DIAGNOSIS — Z79899 Other long term (current) drug therapy: Secondary | ICD-10-CM | POA: Insufficient documentation

## 2017-08-08 DIAGNOSIS — F17210 Nicotine dependence, cigarettes, uncomplicated: Secondary | ICD-10-CM | POA: Insufficient documentation

## 2017-08-08 DIAGNOSIS — J45909 Unspecified asthma, uncomplicated: Secondary | ICD-10-CM | POA: Insufficient documentation

## 2017-08-08 DIAGNOSIS — I10 Essential (primary) hypertension: Secondary | ICD-10-CM | POA: Diagnosis not present

## 2017-08-08 DIAGNOSIS — R197 Diarrhea, unspecified: Secondary | ICD-10-CM | POA: Diagnosis not present

## 2017-08-08 DIAGNOSIS — J40 Bronchitis, not specified as acute or chronic: Secondary | ICD-10-CM

## 2017-08-08 DIAGNOSIS — R6889 Other general symptoms and signs: Secondary | ICD-10-CM

## 2017-08-08 LAB — CBC WITH DIFFERENTIAL/PLATELET
Basophils Absolute: 0.1 10*3/uL (ref 0–0.1)
Basophils Relative: 1 %
Eosinophils Absolute: 0.4 10*3/uL (ref 0–0.7)
Eosinophils Relative: 4 %
HCT: 38.8 % (ref 35.0–47.0)
Hemoglobin: 13.4 g/dL (ref 12.0–16.0)
Lymphocytes Relative: 43 %
Lymphs Abs: 4.2 10*3/uL — ABNORMAL HIGH (ref 1.0–3.6)
MCH: 31.2 pg (ref 26.0–34.0)
MCHC: 34.6 g/dL (ref 32.0–36.0)
MCV: 90.3 fL (ref 80.0–100.0)
Monocytes Absolute: 0.6 10*3/uL (ref 0.2–0.9)
Monocytes Relative: 6 %
Neutro Abs: 4.5 10*3/uL (ref 1.4–6.5)
Neutrophils Relative %: 46 %
Platelets: 236 10*3/uL (ref 150–440)
RBC: 4.3 MIL/uL (ref 3.80–5.20)
RDW: 13.4 % (ref 11.5–14.5)
WBC: 9.8 10*3/uL (ref 3.6–11.0)

## 2017-08-08 LAB — COMPREHENSIVE METABOLIC PANEL WITH GFR
ALT: 8 U/L — ABNORMAL LOW (ref 14–54)
AST: 9 U/L — ABNORMAL LOW (ref 15–41)
Albumin: 3.4 g/dL — ABNORMAL LOW (ref 3.5–5.0)
Alkaline Phosphatase: 104 U/L (ref 38–126)
Anion gap: 9 (ref 5–15)
BUN: <5 mg/dL — ABNORMAL LOW (ref 6–20)
CO2: 29 mmol/L (ref 22–32)
Calcium: 9.1 mg/dL (ref 8.9–10.3)
Chloride: 102 mmol/L (ref 101–111)
Creatinine, Ser: 0.8 mg/dL (ref 0.44–1.00)
GFR calc Af Amer: >60 mL/min
GFR calc non Af Amer: >60 mL/min
Glucose, Bld: 84 mg/dL (ref 65–99)
Potassium: 2.9 mmol/L — ABNORMAL LOW (ref 3.5–5.1)
Sodium: 140 mmol/L (ref 135–145)
Total Bilirubin: 0.4 mg/dL (ref 0.3–1.2)
Total Protein: 7.5 g/dL (ref 6.5–8.1)

## 2017-08-08 LAB — URINALYSIS, COMPLETE (UACMP) WITH MICROSCOPIC
Bilirubin Urine: NEGATIVE
Glucose, UA: NEGATIVE mg/dL
Ketones, ur: NEGATIVE mg/dL
Nitrite: NEGATIVE
Protein, ur: NEGATIVE mg/dL
Specific Gravity, Urine: 1.001 — ABNORMAL LOW (ref 1.005–1.030)
pH: 6 (ref 5.0–8.0)

## 2017-08-08 MED ORDER — AZITHROMYCIN 250 MG PO TABS: 250.0000 mg | ORAL_TABLET | Freq: Every day | ORAL | 0 refills | Status: DC

## 2017-08-08 MED ORDER — KETOROLAC TROMETHAMINE 30 MG/ML IJ SOLN
30.0000 mg | Freq: Once | INTRAMUSCULAR | Status: AC
  Administered 2017-08-08: 30 mg via INTRAVENOUS
  Filled 2017-08-08: qty 1

## 2017-08-08 MED ORDER — BENZONATATE 100 MG PO CAPS
200.0000 mg | ORAL_CAPSULE | Freq: Once | ORAL | Status: AC
  Administered 2017-08-08: 200 mg via ORAL

## 2017-08-08 MED ORDER — METHYLPREDNISOLONE SODIUM SUCC 125 MG IJ SOLR
125.0000 mg | Freq: Once | INTRAMUSCULAR | Status: AC
  Administered 2017-08-08: 125 mg via INTRAVENOUS
  Filled 2017-08-08: qty 2

## 2017-08-08 MED ORDER — ALBUTEROL SULFATE HFA 108 (90 BASE) MCG/ACT IN AERS: 2.0000 | INHALATION_SPRAY | RESPIRATORY_TRACT | 0 refills | Status: AC | PRN

## 2017-08-08 MED ORDER — BENZONATATE 200 MG PO CAPS: 200.0000 mg | ORAL_CAPSULE | Freq: Three times a day (TID) | ORAL | 0 refills | Status: DC | PRN

## 2017-08-08 MED ORDER — ONDANSETRON 4 MG PO TBDP: 4.0000 mg | ORAL_TABLET | Freq: Three times a day (TID) | ORAL | 0 refills | Status: AC | PRN

## 2017-08-08 MED ORDER — PREDNISONE 20 MG PO TABS: ORAL_TABLET | ORAL | 0 refills | Status: DC

## 2017-08-08 MED ORDER — AZITHROMYCIN 500 MG PO TABS
500.0000 mg | ORAL_TABLET | Freq: Once | ORAL | Status: AC
  Administered 2017-08-08: 500 mg via ORAL
  Filled 2017-08-08: qty 1

## 2017-08-08 MED ORDER — SODIUM CHLORIDE 0.9 % IV BOLUS (SEPSIS)
1000.0000 mL | Freq: Once | INTRAVENOUS | Status: AC
  Administered 2017-08-08: 1000 mL via INTRAVENOUS

## 2017-08-08 MED ORDER — BENZONATATE 100 MG PO CAPS
ORAL_CAPSULE | ORAL | Status: AC
  Filled 2017-08-08: qty 2

## 2017-08-08 MED ORDER — ONDANSETRON HCL 4 MG/2ML IJ SOLN
4.0000 mg | Freq: Once | INTRAMUSCULAR | Status: AC
  Administered 2017-08-08: 4 mg via INTRAVENOUS
  Filled 2017-08-08: qty 2

## 2017-08-08 NOTE — ED Provider Notes (Signed)
[facility] Emergency Department Provider Note   ____________________________________________   First MD Initiated Contact with Patient 08/08/17 0530     (approximate)  I have reviewed the triage vital signs and the nursing notes.   HISTORY  Chief Complaint Flulike symptoms   HPI Janet Kemp is a 51 y.o. female who presents to the ED from home with a chief complaint of flulike symptoms.  Patient reports a 3-day history of chills, body aches, nonproductive cough, nausea, vomiting and diarrhea. + sick contacts.  Denies fever, chest pain, shortness of breath, dysuria.  Denies recent travel, trauma or antibiotic use.  No relief with over-the-counter medications.   Past Medical History:  Diagnosis Date  . Asthma   . Back pain   . Breast cyst, left   . Collagen vascular disease (HCC)   . Hypertension   . Lumbago   . Menopause     Patient Active Problem List   Diagnosis Date Noted  . Trichomoniasis 04/15/2017  . 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 Dyspareunia in female 04/13/2017  . Breast cyst, left 04/13/2017    Past Surgical History:  Procedure Laterality Date  . CHOLECYSTECTOMY    . ESOPHAGOGASTRODUODENOSCOPY (EGD) WITH PROPOFOL N/A 11/19/2015   Procedure: ESOPHAGOGASTRODUODENOSCOPY (EGD) WITH PROPOFOL;  Surgeon: Elnita Maxwell, MD;  Location: [facility] ENDOSCOPY;  Service: Endoscopy;  Laterality: N/A;  . LAPAROSCOPIC ASSISTED VAGINAL HYSTERECTOMY  2007    Prior to Admission medications   Medication Sig Start Date End Date Taking? Authorizing Provider  albuterol (PROVENTIL HFA;VENTOLIN HFA) 108 (90 Base) MCG/ACT inhaler Inhale 2 puffs into the lungs every 4 (four) hours as needed for wheezing or shortness of breath. 08/08/17   Irean Hong, MD  ALPRAZolam Prudy Feeler) 1 MG tablet Take 1 mg by mouth 2 (two) times daily. 04/09/17   [provider]  azithromycin (ZITHROMAX) 250 MG tablet Take 1 tablet (250 mg total) by mouth daily.  08/08/17   Irean Hong, MD  baclofen (LIORESAL) 10 MG tablet Take by mouth.    [provider]  benzonatate (TESSALON) 200 MG capsule Take 1 capsule (200 mg total) by mouth 3 (three) times daily as needed for cough. 08/08/17   Irean Hong, MD  butalbital-acetaminophen-caffeine (FIORICET, ESGIC) [phone] MG tablet Take 1 tablet by mouth 3 (three) times daily as needed. 03/23/17   [provider]  estradiol (ESTRACE VAGINAL) 0.1 MG/GM vaginal cream 2g qhs intravaginally for 2 weeks, then reduce to 1g qhs for 1 week, then followed by a maintenance dose of 1 g 2 times per week. Also apply some externally when applying vaginally 04/20/17   Andrews Bing, MD  ibuprofen (ADVIL,MOTRIN) 200 MG tablet Take 200-800 mg by mouth every 6 (six) hours as needed (pain).    [provider]  metroNIDAZOLE (FLAGYL) 500 MG tablet Take two tablets by mouth twice a day, for one day.  Or you can take all four tablets at once if you can tolerate it. 04/16/17    Bing, MD  morphine (MS CONTIN) 30 MG 12 hr tablet Take by mouth.    [provider]  omeprazole (PRILOSEC) 40 MG capsule Take 40 mg by mouth daily.    [provider]  ondansetron (ZOFRAN ODT) 4 MG disintegrating tablet Take 1 tablet (4 mg total) by mouth every 8 (eight) hours as needed for nausea or vomiting. 08/08/17   Irean Hong, MD  oxyCODONE-acetaminophen (PERCOCET) 10-325 MG tablet Take by mouth.    [provider]  predniSONE (DELTASONE) 20 MG tablet 3 tablets daily x 4 days 08/08/17   Irean HongSung, Jade J, MD  Vitamin D, Ergocalciferol, (DRISDOL) 50000 units CAPS capsule Take by mouth.    [provider]    Allergies Cymbalta [duloxetine hcl] and Codeine  Family History  Problem Relation Age of Onset  . Pneumonia Mother   . Uterine cancer Mother     Social History Social History   Tobacco Use  . Smoking status: Current Every Day Smoker    Packs/day: 0.50    Years: 34.00    Pack  years: 17.00    Types: Cigarettes  . Smokeless tobacco: Never Used  Substance Use Topics  . Alcohol use: No  . Drug use: Yes    Types: Marijuana    Review of Systems  Constitutional: Positive for chills and body aches.  No fever. Eyes: No visual changes. ENT: No sore throat. Cardiovascular: Denies chest pain. Respiratory: Positive for cough.  Denies shortness of breath. Gastrointestinal: No abdominal pain.  Positive for nausea, vomiting and diarrhea. No constipation. Genitourinary: Negative for dysuria. Musculoskeletal: Negative for back pain. Skin: Negative for rash. Neurological: Negative for headaches, focal weakness or numbness.   ____________________________________________   PHYSICAL EXAM:  VITAL SIGNS: ED Triage Vitals [08/08/17 0525]  Enc Vitals Group     BP 133/86     Pulse Rate 64     Resp 17     Temp 97.7 F (36.5 C)     Temp Source Oral     SpO2 99 %     Weight 153 lb (69.4 kg)     Height 5\' 2"  (1.575 m)     Head Circumference      Peak Flow      Pain Score 5     Pain Loc      Pain Edu?      Excl. in GC?     Constitutional: Alert and oriented. Well appearing and in no acute distress. Eyes: Conjunctivae are normal. PERRL. EOMI. Head: Atraumatic. Nose: Congestion/rhinnorhea. Mouth/Throat: Mucous membranes are moist.  Oropharynx mildly erythematous without tonsillar swelling, exudates or peritonsillar abscess.  There is no hoarse or muffled voice.  There is no drooling. Neck: No stridor.  Supple neck without meningismus. Cardiovascular: Normal rate, regular rhythm. Grossly normal heart sounds.  Good peripheral circulation. Respiratory: Normal respiratory effort.  No retractions. Lungs CTAB. Gastrointestinal: Soft and nontender to light or deep palpation. No distention. No abdominal bruits. No CVA tenderness. Musculoskeletal: No lower extremity tenderness nor edema.  No joint effusions. Neurologic:  Normal speech and language. No gross focal  neurologic deficits are appreciated.  Skin:  Skin is warm, dry and intact. No rash noted.  No petechiae. Psychiatric: Mood and affect are normal. Speech and behavior are normal.  ____________________________________________   LABS (all labs ordered are listed, but only abnormal results are displayed)  Labs Reviewed  CBC WITH DIFFERENTIAL/PLATELET - Abnormal; Notable for the following components:      Result Value   Lymphs Abs 4.2 (*)    All other components within normal limits  URINALYSIS, COMPLETE (UACMP) WITH MICROSCOPIC - Abnormal; Notable for the following components:   Color, Urine STRAW (*)    APPearance CLEAR (*)    Specific Gravity, Urine 1.001 (*)    Hgb urine dipstick SMALL (*)    Leukocytes, UA TRACE (*)    Bacteria, UA RARE (*)    Squamous Epithelial / LPF 0-5 (*)    All other  components within normal limits  COMPREHENSIVE METABOLIC PANEL   ____________________________________________  EKG  None ____________________________________________  RADIOLOGY  Dg Chest 2 View  Result Date: 08/08/2017 CLINICAL DATA:  Shortness of breath and fever for 4 days. Cough and flu-like symptoms. Asthma. Smoker. EXAM: CHEST  2 VIEW COMPARISON:  03/14/2012 FINDINGS: Heart size and pulmonary vascularity are normal. Peribronchial thickening with diffuse nodular interstitial pattern to the lungs may represent interstitial pneumonitis, edema, or acute bronchitis. No focal consolidation. No blunting of costophrenic angles. No pneumothorax. Old right rib fractures. Surgical clips in the right upper quadrant. IMPRESSION: Peribronchial thickening with diffuse nodular interstitial pattern may represent interstitial pneumonitis, edema, or acute bronchitis. No focal consolidation. Electronically Signed   By: Burman NievesWilliam  Stevens M.D.   On: 08/08/2017 06:44    ____________________________________________   PROCEDURES  Procedure(s) performed: None  Procedures  Critical Care performed:  No  ____________________________________________   INITIAL IMPRESSION / ASSESSMENT AND PLAN / ED COURSE  As part of my medical decision making, I reviewed the following data within the electronic MEDICAL RECORD NUMBER Nursing notes reviewed and incorporated, Labs reviewed, Radiograph reviewed and Notes from prior ED visits.   51 year old female presenting with flulike symptoms and nausea, vomiting and diarrhea. Differential includes, but is not limited to, viral syndrome, bronchitis including COPD exacerbation, pneumonia, reactive airway disease including asthma, CHF including exacerbation with or without pulmonary/interstitial edema, pneumothorax, ACS, thoracic trauma, and pulmonary embolism.  Will obtain screening lab work, urinalysis, x-ray.  Initiate IV fluid resuscitation, analgesia and antiemetic.  Clinical Course as of Aug 08 705  Sat Aug 08, 2017  [id number] Patient asleep.  She tolerated sips of her drink which is at bedside.  Awaiting results of metabolic panel.  [JS]  [id number] Updated patient of plan of care.  Dr. Lenard LancePaduchowski to follow-up results of metabolic panel.  Anticipate discharge home with prednisone, azithromycin, Zofran, Tessalon and albuterol inhaler.  [JS]    Clinical Course User Index [JS] Irean HongSung, Jade J, MD     ____________________________________________   FINAL CLINICAL IMPRESSION(S) / ED DIAGNOSES  Final diagnoses:  Flu-like symptoms  Nausea vomiting and diarrhea  Bronchitis     ED Discharge Orders        Ordered    albuterol (PROVENTIL HFA;VENTOLIN HFA) 108 (90 Base) MCG/ACT inhaler  Every 4 hours PRN     08/08/17 0652    predniSONE (DELTASONE) 20 MG tablet     08/08/17 0652    ondansetron (ZOFRAN ODT) 4 MG disintegrating tablet  Every 8 hours PRN     08/08/17 0652    benzonatate (TESSALON) 200 MG capsule  3 times daily PRN     08/08/17 0652    azithromycin (ZITHROMAX) 250 MG tablet  Daily     08/08/17 [id number]       Note:  This document was prepared  using Dragon voice recognition software and may include unintentional dictation errors.    Irean HongSung, Jade J, MD 08/08/17 [phone]

## 2017-08-08 NOTE — ED Notes (Signed)
Report to ashley, rn

## 2017-08-08 NOTE — Discharge Instructions (Signed)
1.  Finish antibiotic as prescribed (azithromycin 250 mg daily x 4 days).  Start your next dose Sunday morning. 2.  Finish steroid as prescribed (prednisone 20 mg daily x 4 days).  Start your next dose Sunday morning. 3.  You may take nausea medicine as needed (Zofran). 4.  You may take cough medicine as needed (Tessalon). 5.  Use albuterol inhaler 2 puffs every 4 hours as needed for cough or wheezing. 6.  Return to the ER for worsening symptoms, persistent vomiting, difficulty breathing or other concerns.

## 2017-08-08 NOTE — ED Triage Notes (Signed)
Patient coming in from home for flu like symptoms of chills, body aches, cough, nausea, vomiting, and diarrhea. Patient states it started Tuesday and she is not getting any better. Patient has been taking over the counter medications for symptoms

## 2017-08-08 NOTE — ED Provider Notes (Signed)
-----------------------------------------   7:42 AM on 08/08/2017 -----------------------------------------  Patient's metabolic panel has resulted largely within normal limits with a slight hypokalemia.  In reviewing the patient's records this appears to be largely baseline for the patient.  We will discharge per Dr. Ardine BjorkSung's discharge instructions.  I discussed this with the patient who is agreeable.   Minna AntisPaduchowski, Antonius Hartlage, MD 08/08/17 [phone]

## 2017-08-08 NOTE — ED Notes (Signed)
Pt states four days of body aches, nasal congestion, cough and chills. Pt states she has had emesis and diarrhea for one day. Pt states last emesis at 0300 today and last diarrhea at 2200 yesterday. Pt appears in no acute distress. Pt with dry cough noted. Pt is drinking po fluids during assessment.

## 2018-01-14 ENCOUNTER — Other Ambulatory Visit: Payer: Self-pay

## 2018-01-14 ENCOUNTER — Emergency Department
Admission: EM | Admit: 2018-01-14 | Discharge: 2018-01-15 | Disposition: A | Discharge status: Home / Self Care | Payer: No Typology Code available for payment source | Attending: Emergency Medicine | Admitting: Emergency Medicine

## 2018-01-14 ENCOUNTER — Emergency Department: Payer: No Typology Code available for payment source

## 2018-01-14 DIAGNOSIS — Z79899 Other long term (current) drug therapy: Secondary | ICD-10-CM | POA: Diagnosis not present

## 2018-01-14 DIAGNOSIS — G8929 Other chronic pain: Secondary | ICD-10-CM | POA: Insufficient documentation

## 2018-01-14 DIAGNOSIS — J45909 Unspecified asthma, uncomplicated: Secondary | ICD-10-CM | POA: Diagnosis not present

## 2018-01-14 DIAGNOSIS — I10 Essential (primary) hypertension: Secondary | ICD-10-CM | POA: Insufficient documentation

## 2018-01-14 DIAGNOSIS — M25562 Pain in left knee: Secondary | ICD-10-CM | POA: Insufficient documentation

## 2018-01-14 DIAGNOSIS — F17210 Nicotine dependence, cigarettes, uncomplicated: Secondary | ICD-10-CM | POA: Insufficient documentation

## 2018-01-14 MED ORDER — PREDNISONE 50 MG PO TABS: ORAL_TABLET | ORAL | 0 refills | Status: AC

## 2018-01-14 NOTE — ED Triage Notes (Signed)
Pt in with co left knee pain states has injured it a month ago at work and again last week. States was not seen at that time, does want to file workmans comp. Pt has been taking percocet 10 mg without relief. States she has them for chronic back pain, pt is ambulatory to triage.

## 2018-01-14 NOTE — ED Provider Notes (Signed)
[facility] Emergency Department Provider Note  ____________________________________________  Time seen: Approximately 11:36 PM  I have reviewed the triage vital signs and the nursing notes.   HISTORY  Chief Complaint Knee Pain    HPI Janet Kemp is a 52 y.o. female presents to the emergency department with 7 out of 10 chronic "aching" medial left knee pain that has worsened acutely over the past month.  Patient reports the pain is been particularly bad for the past week.  Patient reports that she bumped me at her place of work.  Patient reports that she takes Percocet daily for chronic back pain and Percocet does help some with left knee pain.  She denies weakness, radiculopathy or changes in sensation of the lower extremities.   Past Medical History:  Diagnosis Date  . Asthma   . Back pain   . Breast cyst, left   . Collagen vascular disease (HCC)   . Hypertension   . Lumbago   . Menopause     Patient Active Problem List   Diagnosis Date Noted  . Trichomoniasis 04/15/2017  . 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 Dyspareunia in female 04/13/2017  . Breast cyst, left 04/13/2017    Past Surgical History:  Procedure Laterality Date  . CHOLECYSTECTOMY    . ESOPHAGOGASTRODUODENOSCOPY (EGD) WITH PROPOFOL N/A 11/19/2015   Procedure: ESOPHAGOGASTRODUODENOSCOPY (EGD) WITH PROPOFOL;  Surgeon: Elnita Maxwell, MD;  Location: [facility] END[facility]COPY;  Service: Endoscopy;  Laterality: N/A;  . LAPAROSCOPIC ASSISTED VAGINAL HYSTERECTOMY  2007    Prior to Admission medications   Medication Sig Start Date End Date Taking? Authorizing Provider  albuterol (PROVENTIL HFA;VENTOLIN HFA) 108 (90 Base) MCG/ACT inhaler Inhale 2 puffs into the lungs every 4 (four) hours as needed for wheezing or shortness of breath. 08/08/17   Irean Hong, MD  ALPRAZolam Prudy Feeler) 1 MG tablet Take 1 mg by mouth 2 (two) times daily. 04/09/17   [provider]  azithromycin  (ZITHROMAX) 250 MG tablet Take 1 tablet (250 mg total) by mouth daily. 08/08/17   Irean Hong, MD  baclofen (LIORESAL) 10 MG tablet Take by mouth.    [provider]  benzonatate (TESSALON) 200 MG capsule Take 1 capsule (200 mg total) by mouth 3 (three) times daily as needed for cough. 08/08/17   Irean Hong, MD  butalbital-acetaminophen-caffeine (FIORICET, ESGIC) [phone] MG tablet Take 1 tablet by mouth 3 (three) times daily as needed. 03/23/17   [provider]  estradiol (ESTRACE VAGINAL) 0.1 MG/GM vaginal cream 2g qhs intravaginally for 2 weeks, then reduce to 1g qhs for 1 week, then followed by a maintenance dose of 1 g 2 times per week. Also apply some externally when applying vaginally 04/20/17   [place] Bing, MD  ibuprofen (ADVIL,MOTRIN) 200 MG tablet Take 200-800 mg by mouth every 6 (six) hours as needed (pain).    [provider]  metroNIDAZOLE (FLAGYL) 500 MG tablet Take two tablets by mouth twice a day, for one day.  Or you can take all four tablets at once if you can tolerate it. 04/16/17   [place] Bing, MD  morphine (MS CONTIN) 30 MG 12 hr tablet Take by mouth.    [provider]  omeprazole (PRILOSEC) 40 MG capsule Take 40 mg by mouth daily.    [provider]  ondansetron (ZOFRAN ODT) 4 MG disintegrating tablet Take 1 tablet (4 mg total) by mouth every 8 (eight) hours as needed for nausea or vomiting. 08/08/17  Irean Hong, MD  oxyCODONE-acetaminophen (PERCOCET) 10-325 MG tablet Take by mouth.    [provider]  predniSONE (DELTASONE) 50 MG tablet Take one 50 mg tablet once daily for the next 5 days. 01/14/18   Orvil Feil, PA-C  Vitamin D, Ergocalciferol, (DRISDOL) 50000 units CAPS capsule Take by mouth.    [provider]    Allergies Cymbalta [duloxetine hcl] and Codeine  Family History  Problem Relation Age of Onset  . Pneumonia Mother   . Uterine cancer Mother     Social History Social History    Tobacco Use  . Smoking status: Current Every Day Smoker    Packs/day: 0.50    Years: 34.00    Pack years: 17.00    Types: Cigarettes  . Smokeless tobacco: Never Used  Substance Use Topics  . Alcohol use: No  . Drug use: Yes    Types: Marijuana     Review of Systems  Constitutional: No fever/chills Eyes: No visual changes. No discharge ENT: No upper respiratory complaints. Cardiovascular: no chest pain. Respiratory: no cough. No SOB. Gastrointestinal: No abdominal pain.  No nausea, no vomiting.  No diarrhea.  No constipation. Musculoskeletal: Patient has left knee pain.  Skin: Negative for rash, abrasions, lacerations, ecchymosis. Neurological: Negative for headaches, focal weakness or numbness.  ____________________________________________   PHYSICAL EXAM:  VITAL SIGNS: ED Triage Vitals [01/14/18 2130]  Enc Vitals Group     BP 119/86     Pulse Rate 63     Resp 20     Temp 98.4 F (36.9 C)     Temp Source Oral     SpO2 100 %     Weight 145 lb (65.8 kg)     Height  (1.575 m)     Head Circumference      Peak Flow      Pain Score 7     Pain Loc      Pain Edu?      Excl. in GC?      Constitutional: Alert and oriented. Well appearing and in no acute distress. Eyes: Conjunctivae are normal. PERRL. EOMI. Head: Atraumatic. Cardiovascular: Normal rate, regular rhythm. Normal S1 and S2.  Good peripheral circulation. Respiratory: Normal respiratory effort without tachypnea or retractions. Lungs CTAB. Good air entry to the bases with no decreased or absent breath sounds. Musculoskeletal: Large left suprapatellar knee effusion visualized. Tenderness elicited with palpation of left medial joint line. Palpable dorsalis pedis pulse, left.   Neurologic:  Normal speech and language. No gross focal neurologic deficits are appreciated.  Skin:  Skin is warm, dry and intact. No rash noted. Psychiatric: Mood and affect are normal. Speech and behavior are normal. Patient  exhibits appropriate insight and judgement.   ____________________________________________   LABS (all labs ordered are listed, but only abnormal results are displayed)  Labs Reviewed - No data to display ____________________________________________  EKG   ____________________________________________  RADIOLOGY  Geraldo Pitter, personally viewed and evaluated these images (plain radiographs) as part of my medical decision making, as well as reviewing the written report by the radiologist.  Dg Knee Complete 4 Views Left  Result Date: 01/14/2018 CLINICAL DATA:  52 year old female with left knee pain EXAM: LEFT KNEE - COMPLETE 4+ VIEW COMPARISON:  None. FINDINGS: Moderate suprapatellar knee joint effusion. No evidence of acute fracture or malalignment. No significant degenerative changes. Normal bony mineralization. No lytic or blastic osseous lesion. Soft tissues are otherwise unremarkable. IMPRESSION: 1. Moderate suprapatellar knee  joint effusion. 2. Otherwise, normal knee radiographs. Electronically Signed   By: Malachy Moan M.D.   On: 01/14/2018 21:51    ____________________________________________    PROCEDURES  Procedure(s) performed:    Procedures    Medications - No data to display   ____________________________________________   INITIAL IMPRESSION / ASSESSMENT AND PLAN / ED COURSE  Pertinent labs & imaging results that were available during my care of the patient were reviewed by me and considered in my medical decision making (see chart for details).  Review of the [place] CSRS was performed in accordance of the [facility] prior to dispensing any controlled drugs.     Assessment and Plan:  Left Knee Pain:  Patient presents to the emergency department with an exacerbation of chronic left knee pain. Differential diagnosis included fracture versus meniscal tear. Patient was treated empirically with prednisone. She was advised to follow up with ortho. All patient  questions were answered.     ____________________________________________  FINAL CLINICAL IMPRESSION(S) / ED DIAGNOSES  Final diagnoses:  Chronic pain of left knee      NEW MEDICATIONS STARTED DURING THIS VISIT:  ED Discharge Orders        Ordered    predniSONE (DELTASONE) 50 MG tablet     01/14/18 2328          This chart was dictated using voice recognition software/Dragon. Despite best efforts to proofread, errors can occur which can change the meaning. Any change was purely unintentional.    Orvil Feil, PA-C 01/15/18 0003    Myrna Blazer, MD 01/15/18 0005

## 2018-06-03 ENCOUNTER — Other Ambulatory Visit: Payer: Self-pay

## 2018-09-23 ENCOUNTER — Ambulatory Visit (INDEPENDENT_AMBULATORY_CARE_PROVIDER_SITE_OTHER): Payer: BLUE CROSS/BLUE SHIELD | Admitting: Obstetrics and Gynecology

## 2018-09-23 ENCOUNTER — Encounter: Payer: Self-pay | Admitting: Obstetrics and Gynecology

## 2018-09-23 VITALS — BP 110/74 | HR 90 | Wt 153.0 lb

## 2018-09-23 DIAGNOSIS — N6452 Nipple discharge: Secondary | ICD-10-CM | POA: Diagnosis not present

## 2018-09-23 DIAGNOSIS — Z01419 Encounter for gynecological examination (general) (routine) without abnormal findings: Secondary | ICD-10-CM | POA: Diagnosis not present

## 2018-09-23 DIAGNOSIS — B373 Candidiasis of vulva and vagina: Secondary | ICD-10-CM | POA: Diagnosis not present

## 2018-09-23 DIAGNOSIS — E221 Hyperprolactinemia: Secondary | ICD-10-CM | POA: Diagnosis not present

## 2018-09-23 DIAGNOSIS — N6002 Solitary cyst of left breast: Secondary | ICD-10-CM | POA: Diagnosis not present

## 2018-09-23 DIAGNOSIS — Z113 Encounter for screening for infections with a predominantly sexual mode of transmission: Secondary | ICD-10-CM | POA: Diagnosis not present

## 2018-09-23 DIAGNOSIS — N898 Other specified noninflammatory disorders of vagina: Secondary | ICD-10-CM | POA: Diagnosis not present

## 2018-09-23 NOTE — Progress Notes (Signed)
Obstetrics and Gynecology Annual Patient Evaluation  Appointment Date: 09/23/2018  OBGYN Clinic: [facility]  Primary Care Provider: Evelene Croon  Referring Provider: Evelene Croon, MD  Chief Complaint:  Chief Complaint  Patient presents with  . Gynecologic Exam  left nipple discharge  History of Present Illness: Janet Kemp is a 53 y.o. Caucasian, seen for the above chief complaint. Her past medical history is significant for h/o hyst, tobacco abuse  Left breast: patient with known left breast lump near sternum and states she's had mammo and ultrasound of it and it was fine. Unsure of last time had mammo (?3 years ago) but states it was somewhere in town. At last annual in mid 2018, patient declined repeat mammo.  She states she noticed left nipple discharge about 52m ago. She states that she was scratching her breast and noticed wetness on her hand and noticed nipple discharge that is white and creamy and was blood tinged. She states that she usually will milk her left breast once a month and it continues to be white and creamy and blood tinged but only at the end of milking. She also states that it is always from the same spot, no prior history of this, no pain and no other breast lumps. It doesn't sound like it spontaneously leaks but she states that she's sure if she doesn't milk it on her own that it would.   HRT: using vaginal ring q90d and no problems or issues.   No abdominal pain, dysuria, hematuria, vaginal itching, dyspareunia,  blood in BMs, vaginal bleeding or spotting.   Review of Systems: as noted in the History of Present Illness.  Patient Active Problem List   Diagnosis Date Noted  . Discharge from left nipple 09/23/2018  . Trichomoniasis 04/15/2017  . 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 Dyspareunia in female 04/13/2017  . Breast cyst, left 04/13/2017    Past Medical History:  Past Medical History:  Diagnosis Date   . Asthma   . Back pain   . Breast cyst, left   . Collagen vascular disease (HCC)   . Hypertension   . Lumbago   . Menopause     Past Surgical History:  Past Surgical History:  Procedure Laterality Date  . CESAREAN SECTION    . CHOLECYSTECTOMY    . ESOPHAGOGASTRODUODENOSCOPY (EGD) WITH PROPOFOL N/A 11/19/2015   Procedure: ESOPHAGOGASTRODUODENOSCOPY (EGD) WITH PROPOFOL;  Surgeon: Elnita Maxwell, MD;  Location: [facility] ENDOSCOPY;  Service: Endoscopy;  Laterality: N/A;  . LAPAROSCOPIC ASSISTED VAGINAL HYSTERECTOMY  2007     Past Gynecological History: As per HPI.  Social History:  Social History   Socioeconomic History  . Marital status: Widowed    Spouse name: Not on file  . Number of children: Not on file  . Years of education: Not on file  . Highest education level: Not on file  Occupational History  . Not on file  Social Needs  . Financial resource strain: Not on file  . Food insecurity:    Worry: Not on file    Inability: Not on file  . Transportation needs:    Medical: Not on file    Non-medical: Not on file  Tobacco Use  . Smoking status: Current Every Day Smoker    Packs/day: 0.50    Years: 34.00    Pack years: 17.00    Types: Cigarettes  . Smokeless tobacco: Never Used  Substance and Sexual Activity  . Alcohol use: No  . Drug  use: Yes    Types: Marijuana  . Sexual activity: Never  Lifestyle  . Physical activity:    Days per week: Not on file    Minutes per session: Not on file  . Stress: Not on file  Relationships  . Social connections:    Talks on phone: Not on file    Gets together: Not on file    Attends religious service: Not on file    Active member of club or organization: Not on file    Attends meetings of clubs or organizations: Not on file    Relationship status: Not on file  . Intimate partner violence:    Fear of current or ex partner: Not on file    Emotionally abused: Not on file    Physically abused: Not on file    Forced  sexual activity: Not on file  Other Topics Concern  . Not on file  Social History Narrative  . Not on file    Family History:  Family History  Problem Relation Age of Onset  . Pneumonia Mother   . Uterine cancer Mother     Health Maintenance:  Mammogram(s): Yes.   [facility]. Patient states she has declined  Medications Florence CannerLeslie J. Moors had no medications administered during this visit. Current Outpatient Medications  Medication Sig Dispense Refill  . albuterol (PROVENTIL HFA;VENTOLIN HFA) 108 (90 Base) MCG/ACT inhaler Inhale 2 puffs into the lungs every 4 (four) hours as needed for wheezing or shortness of breath. 1 Inhaler 0  . ALPRAZolam (XANAX) 1 MG tablet Take 1 mg by mouth 2 (two) times daily.  2  . estradiol (ESTRING) 2 MG vaginal ring Place 2 mg vaginally every 3 (three) months. follow package directions    . morphine (MS CONTIN) 30 MG 12 hr tablet Take by mouth.    Marland Kitchen. omeprazole (PRILOSEC) 40 MG capsule Take 40 mg by mouth daily.    . ondansetron (ZOFRAN ODT) 4 MG disintegrating tablet Take 1 tablet (4 mg total) by mouth every 8 (eight) hours as needed for nausea or vomiting. 20 tablet 0  . oxyCODONE-acetaminophen (PERCOCET) 10-325 MG tablet Take by mouth.    . predniSONE (DELTASONE) 50 MG tablet Take one 50 mg tablet once daily for the next 5 days. 5 tablet 0  . Vitamin D, Ergocalciferol, (DRISDOL) 50000 units CAPS capsule Take by mouth.    Marland Kitchen. azithromycin (ZITHROMAX) 250 MG tablet Take 1 tablet (250 mg total) by mouth daily. (Patient not taking: Reported on 09/23/2018) 4 each 0  . baclofen (LIORESAL) 10 MG tablet Take by mouth.    . benzonatate (TESSALON) 200 MG capsule Take 1 capsule (200 mg total) by mouth 3 (three) times daily as needed for cough. (Patient not taking: Reported on 09/23/2018) 20 capsule 0  . butalbital-acetaminophen-caffeine (FIORICET, ESGIC) 50-325-40 MG tablet Take 1 tablet by mouth 3 (three) times daily as needed.  5  . estradiol  (ESTRACE VAGINAL) 0.1 MG/GM vaginal cream 2g qhs intravaginally for 2 weeks, then reduce to 1g qhs for 1 week, then followed by a maintenance dose of 1 g 2 times per week. Also apply some externally when applying vaginally (Patient not taking: Reported on 09/23/2018) 42.5 g 12  . ibuprofen (ADVIL,MOTRIN) 200 MG tablet Take 200-800 mg by mouth every 6 (six) hours as needed (pain).    . metroNIDAZOLE (FLAGYL) 500 MG tablet Take two tablets by mouth twice a day, for one day.  Or you can take  all four tablets at once if you can tolerate it. (Patient not taking: Reported on 09/23/2018) 4 tablet 0   No current facility-administered medications for this visit.     Allergies Cymbalta [duloxetine hcl] and Codeine   Physical Exam:  BP 110/74   Pulse 90   Wt 153 lb (69.4 kg)   BMI 27.98 kg/m  Body mass index is 27.98 kg/m. Weight last year: 155 lbs General appearance: Well nourished, well developed female in no acute distress.  Neck:  Supple, normal appearance, and no thyromegaly  Cardiovascular: normal s1 and s2.  No murmurs, rubs or gallops. Respiratory:  Clear to auscultation bilateral. Normal respiratory effort Abdomen: positive bowel sounds and no masses, hernias; diffusely non tender to palpation, non distended Breasts: -Left: at the mid way part and just to the left of the sternum is a mass (3x3cm) that is mobile, nttp and feels hard. Just adjacent to the areola at 1 o'clock with 2-3 1cm breast lumps that are nttp, mobile and no skin changes. With squeezing the nipple, white, creamy d/c noted from single duct in the LUQ of the nipple. No axiallary LAD -Right breast normal Neuro/Psych:  Normal mood and affect.  Skin:  Warm and dry.  Lymphatic:  No inguinal lymphadenopathy.   Pelvic exam: is not limited by body habitus EGBUS: within normal limits, Vagina: within normal limits and with no blood or discharge in the vault, Cuff: normal, nttp Adnexa:  normal adnexa and no mass, fullness,  tenderness Rectovaginal: deferred  Laboratory: none  Radiology: none  Assessment: pt stable  Plan:  1. Well woman exam Routine care. Pt would like like STI testing. I recommend colonoscopy for CRC screening but patient declines.  - Cervicovaginal ancillary only( [place]) - HIV antibody (with reflex) - RPR - Hepatitis B Surface AntiGEN - Hepatitis C Antibody  2. Discharge from left nipple Strongly recommend that she get this evaluated asap which she is amenable to.  - MM DIAG BREAST TOMO BILATERAL; Future - US BREAST COMPLETE UNI LEFT INC AXILLA; Future - TSH - Prolactin  3. HRT therapy Okay to continue  RTC PRN  Cornelia Copa MD Attending Center for [facility])

## 2018-09-23 NOTE — Progress Notes (Signed)
Creamy discharge in left breast started about six months  STI testing  Mammogram schedule today

## 2018-09-24 DIAGNOSIS — E221 Hyperprolactinemia: Secondary | ICD-10-CM | POA: Insufficient documentation

## 2018-09-24 LAB — RPR: RPR Ser Ql: NONREACTIVE

## 2018-09-24 LAB — HEPATITIS C ANTIBODY: Hep C Virus Ab: <0.1 s/co ratio (ref 0.0–0.9)

## 2018-09-24 LAB — CERVICOVAGINAL ANCILLARY ONLY
BACTERIAL VAGINITIS: NEGATIVE
CANDIDA VAGINITIS: POSITIVE — AB
Chlamydia: NEGATIVE
NEISSERIA GONORRHEA: NEGATIVE
TRICH (WINDOWPATH): NEGATIVE

## 2018-09-24 LAB — HEPATITIS B SURFACE ANTIGEN: Hepatitis B Surface Ag: NEGATIVE

## 2018-09-24 LAB — PROLACTIN: Prolactin: 52.2 ng/mL — ABNORMAL HIGH (ref 4.8–23.3)

## 2018-09-24 LAB — HIV ANTIBODY (ROUTINE TESTING W REFLEX): HIV SCREEN 4TH GENERATION: NONREACTIVE

## 2018-09-24 LAB — TSH: TSH: 2.47 u[IU]/mL (ref 0.450–4.500)

## 2018-09-24 NOTE — Addendum Note (Signed)
Addended by: Olney Bing on: 09/24/2018 02:50 PM   Modules accepted: Orders

## 2018-09-27 ENCOUNTER — Telehealth: Payer: Self-pay | Admitting: *Deleted

## 2018-09-27 NOTE — Telephone Encounter (Signed)
-----   Message from [place] Bing, MD sent at 09/24/2018  2:50 PM EST ----- Can you let her know that her prolactin level is a little high but we need a morning fasting to confirm this. Please have her come in for an AM fasting prolactin level. Thank you

## 2018-09-27 NOTE — Telephone Encounter (Signed)
Called patient to inform her of her lab results and Dr Vergie Living would like her to come in for a fasting lab. Appointment made to come in.

## 2018-09-28 ENCOUNTER — Telehealth: Payer: Self-pay | Admitting: *Deleted

## 2018-09-28 MED ORDER — FLUCONAZOLE 150 MG PO TABS: 150.0000 mg | ORAL_TABLET | Freq: Once | ORAL | 1 refills | Status: AC

## 2018-09-28 NOTE — Telephone Encounter (Signed)
Pt informed of Pap results, and requesting Diflucan to be sent in.

## 2018-09-28 NOTE — Telephone Encounter (Signed)
-----   Message from Charlie Pickens, MD sent at 09/28/2018  8:15 AM EST ----- She has a yeast infection. Monistat 7 should clear this up. Thanks 

## 2018-09-28 NOTE — Telephone Encounter (Signed)
-----   Message from [place] Bing, MD sent at 09/28/2018  8:15 AM EST ----- She has a yeast infection. Monistat 7 should clear this up. Thanks

## 2018-10-07 ENCOUNTER — Other Ambulatory Visit: Payer: BLUE CROSS/BLUE SHIELD

## 2018-10-07 DIAGNOSIS — E221 Hyperprolactinemia: Secondary | ICD-10-CM

## 2018-10-08 LAB — PROLACTIN: PROLACTIN: 19.1 ng/mL (ref 4.8–23.3)

## 2018-10-11 ENCOUNTER — Telehealth: Payer: Self-pay | Admitting: *Deleted

## 2018-10-11 ENCOUNTER — Encounter: Payer: Self-pay | Admitting: Radiology

## 2018-10-11 NOTE — Telephone Encounter (Signed)
Called patient and informed her of her negative lab results. Also asked patient about breast biopsy, and she is scheduled on Feb 10, will inform Dr Vergie Living.  Scheryl Marten, RN

## 2018-10-11 NOTE — Telephone Encounter (Signed)
-----   Message from [place] Bing, MD sent at 10/11/2018  7:42 AM EST ----- Can you let her know that her prolactin was negative and can you ask her if they did a breast biopsy when she went to [facility] last week? thanks

## 2018-11-17 ENCOUNTER — Telehealth: Payer: Self-pay

## 2018-11-17 NOTE — Telephone Encounter (Signed)
Received patient results regarding breast biopsy. Called to make her aware of results.

## 2019-08-24 ENCOUNTER — Encounter: Payer: Self-pay | Admitting: Radiology

## 2020-09-15 ENCOUNTER — Other Ambulatory Visit: Payer: Self-pay

## 2020-09-15 ENCOUNTER — Emergency Department: Payer: BLUE CROSS/BLUE SHIELD

## 2020-09-15 ENCOUNTER — Emergency Department
Admission: EM | Admit: 2020-09-15 | Discharge: 2020-09-15 | Disposition: A | Discharge status: Home / Self Care | Payer: BLUE CROSS/BLUE SHIELD | Attending: Emergency Medicine | Admitting: Emergency Medicine

## 2020-09-15 DIAGNOSIS — F17210 Nicotine dependence, cigarettes, uncomplicated: Secondary | ICD-10-CM | POA: Insufficient documentation

## 2020-09-15 DIAGNOSIS — H81391 Other peripheral vertigo, right ear: Secondary | ICD-10-CM | POA: Diagnosis not present

## 2020-09-15 DIAGNOSIS — S92324A Nondisplaced fracture of second metatarsal bone, right foot, initial encounter for closed fracture: Secondary | ICD-10-CM | POA: Insufficient documentation

## 2020-09-15 DIAGNOSIS — R519 Headache, unspecified: Secondary | ICD-10-CM | POA: Diagnosis not present

## 2020-09-15 DIAGNOSIS — S99921A Unspecified injury of right foot, initial encounter: Secondary | ICD-10-CM | POA: Diagnosis present

## 2020-09-15 DIAGNOSIS — I10 Essential (primary) hypertension: Secondary | ICD-10-CM | POA: Insufficient documentation

## 2020-09-15 DIAGNOSIS — W19XXXA Unspecified fall, initial encounter: Secondary | ICD-10-CM | POA: Diagnosis not present

## 2020-09-15 DIAGNOSIS — J45909 Unspecified asthma, uncomplicated: Secondary | ICD-10-CM | POA: Diagnosis not present

## 2020-09-15 LAB — CBC
HCT: 43.8 % (ref 36.0–46.0)
Hemoglobin: 14.5 g/dL (ref 12.0–15.0)
MCH: 30.5 pg (ref 26.0–34.0)
MCHC: 33.1 g/dL (ref 30.0–36.0)
MCV: 92 fL (ref 80.0–100.0)
Platelets: 279 10*3/uL (ref 150–400)
RBC: 4.76 MIL/uL (ref 3.87–5.11)
RDW: 13.3 % (ref 11.5–15.5)
WBC: 11.5 10*3/uL — ABNORMAL HIGH (ref 4.0–10.5)
nRBC: 0 % (ref 0.0–0.2)

## 2020-09-15 LAB — COMPREHENSIVE METABOLIC PANEL
ALT: 12 U/L (ref 0–44)
AST: 31 U/L (ref 15–41)
Albumin: 3.7 g/dL (ref 3.5–5.0)
Alkaline Phosphatase: 103 U/L (ref 38–126)
Anion gap: 14 (ref 5–15)
BUN: 6 mg/dL (ref 6–20)
CO2: 27 mmol/L (ref 22–32)
Calcium: 9.2 mg/dL (ref 8.9–10.3)
Chloride: 97 mmol/L — ABNORMAL LOW (ref 98–111)
Creatinine, Ser: 0.7 mg/dL (ref 0.44–1.00)
GFR, Estimated: >60 mL/min (ref >60)
Glucose, Bld: 121 mg/dL — ABNORMAL HIGH (ref 70–99)
Potassium: 3.5 mmol/L (ref 3.5–5.1)
Sodium: 138 mmol/L (ref 135–145)
Total Bilirubin: 0.9 mg/dL (ref 0.3–1.2)
Total Protein: 7.7 g/dL (ref 6.5–8.1)

## 2020-09-15 LAB — LIPASE, BLOOD: Lipase: 19 U/L (ref 11–51)

## 2020-09-15 MED ORDER — SCOPOLAMINE 1 MG/3DAYS TD PT72
1.0000 | MEDICATED_PATCH | TRANSDERMAL | Status: DC
  Administered 2020-09-15: 1.5 mg via TRANSDERMAL
  Filled 2020-09-15: qty 1

## 2020-09-15 MED ORDER — SCOPOLAMINE 1 MG/3DAYS TD PT72: 1.0000 | MEDICATED_PATCH | TRANSDERMAL | 1 refills | Status: AC

## 2020-09-15 MED ORDER — MECLIZINE HCL 25 MG PO TABS: 25.0000 mg | ORAL_TABLET | Freq: Three times a day (TID) | ORAL | 1 refills | Status: AC | PRN

## 2020-09-15 MED ORDER — MECLIZINE HCL 25 MG PO TABS
50.0000 mg | ORAL_TABLET | Freq: Once | ORAL | Status: AC
  Administered 2020-09-15: 50 mg via ORAL
  Filled 2020-09-15: qty 2

## 2020-09-15 NOTE — ED Provider Notes (Signed)
[facility] Emergency Department Provider Note  ____________________________________________  Time seen: Approximately 10:59 PM  I have reviewed the triage vital signs and the nursing notes.   HISTORY  Chief Complaint Nausea, Emesis, and Fall    HPI Janet Kemp is a 55 y.o. female with a history of hypertension who comes ED complaining of intermittent episodes of dizziness characterized by motion and room spinning that is sudden onset, last for a few hours, seems to get worse with change of position or turning her head.  This has been going on for the last few weeks, had never happened before.  No recent head trauma.  No fevers chills or neck pain.  This is caused her to fall a few times, resulting in foot pain and swelling that started today after a fall.  Foot pain is constant and worse with weightbearing.  No leg swelling, no chest pain or shortness of breath.  No palpitations.  No loss of consciousness.  Not on blood thinners.      Past Medical History:  Diagnosis Date  . Asthma   . Back pain   . Breast cyst, left   . Collagen vascular disease ([place])   . Hypertension   . Lumbago   . Menopause      Patient Active Problem List   Diagnosis Date Noted  . Discharge from left nipple 09/23/2018  . Trichomoniasis 04/15/2017  . 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 Dyspareunia in female 04/13/2017  . Breast cyst, left 04/13/2017     Past Surgical History:  Procedure Laterality Date  . CESAREAN SECTION    . CHOLECYSTECTOMY    . ESOPHAGOGASTRODUODENOSCOPY (EGD) WITH PROPOFOL N/A 11/19/2015   Procedure: ESOPHAGOGASTRODUODENOSCOPY (EGD) WITH PROPOFOL;  Surgeon: Josefine Class, MD;  Location: [facility] ENDOSCOPY;  Service: Endoscopy;  Laterality: N/A;  . LAPAROSCOPIC ASSISTED VAGINAL HYSTERECTOMY  2007     Prior to Admission medications   Medication Sig Start Date End Date Taking? Authorizing Provider  meclizine (ANTIVERT) 25 MG tablet Take 1  tablet (25 mg total) by mouth 3 (three) times daily as needed for dizziness or nausea. 09/15/20  Yes Carrie Mew, MD  scopolamine (TRANSDERM-SCOP, 1.5 MG,) 1 MG/3DAYS Place 1 patch (1.5 mg total) onto the skin every 3 (three) days. 09/15/20 09/15/21 Yes Carrie Mew, MD  albuterol (PROVENTIL HFA;VENTOLIN HFA) 108 (90 Base) MCG/ACT inhaler Inhale 2 puffs into the lungs every 4 (four) hours as needed for wheezing or shortness of breath. 08/08/17   Paulette Blanch, MD  ALPRAZolam Duanne Moron) 1 MG tablet Take 1 mg by mouth 2 (two) times daily. 04/09/17   [provider]  azithromycin (ZITHROMAX) 250 MG tablet Take 1 tablet (250 mg total) by mouth daily. Patient not taking: Reported on 09/23/2018 08/08/17   Paulette Blanch, MD  baclofen (LIORESAL) 10 MG tablet Take by mouth.    [provider]  benzonatate (TESSALON) 200 MG capsule Take 1 capsule (200 mg total) by mouth 3 (three) times daily as needed for cough. Patient not taking: Reported on 09/23/2018 08/08/17   Paulette Blanch, MD  butalbital-acetaminophen-caffeine (FIORICET, ESGIC) [phone] MG tablet Take 1 tablet by mouth 3 (three) times daily as needed. 03/23/17   [provider]  estradiol (ESTRACE VAGINAL) 0.1 MG/GM vaginal cream 2g qhs intravaginally for 2 weeks, then reduce to 1g qhs for 1 week, then followed by a maintenance dose of 1 g 2 times per week. Also apply some externally when applying vaginally Patient not taking: Reported  on 09/23/2018 04/20/17   [place] Bing, MD  estradiol (ESTRING) 2 MG vaginal ring Place 2 mg vaginally every 3 (three) months. follow package directions    [provider]  ibuprofen (ADVIL,MOTRIN) 200 MG tablet Take 200-800 mg by mouth every 6 (six) hours as needed (pain).    [provider]  metroNIDAZOLE (FLAGYL) 500 MG tablet Take two tablets by mouth twice a day, for one day.  Or you can take all four tablets at once if you can tolerate it. Patient not taking: Reported on 09/23/2018  04/16/17   [place] Bing, MD  morphine (MS CONTIN) 30 MG 12 hr tablet Take by mouth.    [provider]  omeprazole (PRILOSEC) 40 MG capsule Take 40 mg by mouth daily.    [provider]  ondansetron (ZOFRAN ODT) 4 MG disintegrating tablet Take 1 tablet (4 mg total) by mouth every 8 (eight) hours as needed for nausea or vomiting. 08/08/17   Irean Hong, MD  oxyCODONE-acetaminophen (PERCOCET) 10-325 MG tablet Take by mouth.    [provider]  predniSONE (DELTASONE) 50 MG tablet Take one 50 mg tablet once daily for the next 5 days. 01/14/18   Orvil Feil, PA-C  Vitamin D, Ergocalciferol, (DRISDOL) 50000 units CAPS capsule Take by mouth.    [provider]     Allergies Cymbalta [duloxetine hcl] and Codeine   Family History  Problem Relation Age of Onset  . Pneumonia Mother   . Uterine cancer Mother     Social History Social History   Tobacco Use  . Smoking status: Current Every Day Smoker    Packs/day: 0.50    Years: 34.00    Pack years: 17.00    Types: Cigarettes  . Smokeless tobacco: Never Used  Substance Use Topics  . Alcohol use: No  . Drug use: Yes    Types: Marijuana    Review of Systems  Constitutional:   No fever or chills.  ENT:   No sore throat. No rhinorrhea. Cardiovascular:   No chest pain or syncope. Respiratory:   No dyspnea or cough. Gastrointestinal:   Negative for abdominal pain, vomiting and diarrhea.  Musculoskeletal:   Right foot pain and swelling All other systems reviewed and are negative except as documented above in ROS and HPI.  ____________________________________________   PHYSICAL EXAM:  VITAL SIGNS: ED Triage Vitals  Enc Vitals Group     BP 09/15/20 1637 128/78     Pulse Rate 09/15/20 1635 66     Resp 09/15/20 1635 18     Temp 09/15/20 1635 98.8 F (37.1 C)     Temp Source 09/15/20 1635 Oral     SpO2 09/15/20 1635 98 %     Weight 09/15/20 1636 130 lb (59 kg)     Height 09/15/20 1636 5\' 3"   (1.6 m)     Head Circumference --      Peak Flow --      Pain Score 09/15/20 1635 10     Pain Loc --      Pain Edu? --      Excl. in GC? --     Vital signs reviewed, nursing assessments reviewed.   Constitutional:   Alert and oriented. Non-toxic appearance. Eyes:   Conjunctivae are normal. EOMI. PERRL.  No nystagmus ENT      Head:   Normocephalic and atraumatic.      Nose:   Wearing a mask.      Mouth/Throat:  Wearing a mask.      Neck:   No meningismus. Full ROM. Hematological/Lymphatic/Immunilogical:   No cervical lymphadenopathy. Cardiovascular:   RRR. Symmetric bilateral radial and DP pulses.  No murmurs. Cap refill less than 2 seconds. Respiratory:   Normal respiratory effort without tachypnea/retractions. Breath sounds are clear and equal bilaterally. No wheezes/rales/rhonchi. Gastrointestinal:   Soft and nontender. Non distended. There is no CVA tenderness.  No rebound, rigidity, or guarding.  Musculoskeletal:   Normal range of motion in all extremities.  There is swelling at the right forefoot dorsally with some ecchymosis.  There is tenderness over the distal metatarsals. Neurologic:   Normal speech and language.  Cranial nerves III through XII intact Dix-Hallpike maneuver positive with head turn to the right. Motor grossly intact.  Cerebellar function normal, sensation normal No acute focal neurologic deficits are appreciated.  Skin:    Skin is warm, dry and intact. No rash noted.  No petechiae, purpura, or bullae.  ____________________________________________    LABS (pertinent positives/negatives) (all labs ordered are listed, but only abnormal results are displayed) Labs Reviewed  COMPREHENSIVE METABOLIC PANEL - Abnormal; Notable for the following components:      Result Value   Chloride 97 (*)    Glucose, Bld 121 (*)    All other components within normal limits  CBC - Abnormal; Notable for the following components:   WBC 11.5 (*)    All other components  within normal limits  LIPASE, BLOOD   ____________________________________________   EKG  Interpreted by me Sinus bradycardia rate of 58, normal axis and intervals.  Poor R wave progression.  Normal ST segments and T waves.  ____________________________________________    RADIOLOGY  CT HEAD WO CONTRAST  Result Date: 09/15/2020 CLINICAL DATA:  Dizziness, nausea, vomiting, multiple falls EXAM: CT HEAD WITHOUT CONTRAST TECHNIQUE: Contiguous axial images were obtained from the base of the skull through the vertex without intravenous contrast. COMPARISON:  11/30/2015 FINDINGS: Brain: Normal anatomic configuration. No abnormal intra or extra-axial mass lesion or fluid collection. No abnormal mass effect or midline shift. No evidence of acute intracranial hemorrhage or infarct. Ventricular size is normal. Cerebellum unremarkable. Vascular: Unremarkable Skull: Intact Sinuses/Orbits: Paranasal sinuses are clear. Orbits are unremarkable. Other: Mastoid air cells and middle ear cavities are clear. IMPRESSION: No acute intracranial abnormality.  Normal examination. Electronically Signed   By: Helyn Numbers MD   On: 09/15/2020 21:21   DG Foot 2 Views Right  Result Date: 09/15/2020 CLINICAL DATA:  Fall with swelling EXAM: RIGHT FOOT - 2 VIEW COMPARISON:  None. FINDINGS: Acute minimally displaced fractures involving the necks of the second third and fourth metatarsals. No subluxation. IMPRESSION: Acute minimally displaced fractures involving the necks of the second through fourth metatarsals. Electronically Signed   By: Jasmine Pang M.D.   On: 09/15/2020 17:07    ____________________________________________   PROCEDURES Procedures  ____________________________________________  DIFFERENTIAL DIAGNOSIS   Foot fracture, intracranial tumor, positional vertigo, electrolyte abnormality  CLINICAL IMPRESSION / ASSESSMENT AND PLAN / ED COURSE  Medications ordered in the ED: Medications  scopolamine  (TRANSDERM-SCOP) 1 MG/3DAYS 1.5 mg (1.5 mg Transdermal Patch Applied 09/15/20 2234)  meclizine (ANTIVERT) tablet 50 mg (50 mg Oral Given 09/15/20 2135)    Pertinent labs & imaging results that were available during my care of the patient were reviewed by me and considered in my medical decision making (see chart for details).  Janet Kemp was evaluated in Emergency Department on 09/15/2020 for the symptoms described in the  history of present illness. She was evaluated in the context of the global COVID-19 pandemic, which necessitated consideration that the patient might be at risk for infection with the SARS-CoV-2 virus that causes COVID-19. Institutional protocols and algorithms that pertain to the evaluation of patients at risk for COVID-19 are in a state of rapid change based on information released by regulatory bodies including the CDC and federal and state organizations. These policies and algorithms were followed during the patient's care in the ED.   Patient presents with intermittent vertigo, on exam is consistent with BPPV.  Since this is new onset for her, CT scan obtained which is unremarkable.  Vital signs are normal, labs normal.  Foot x-ray viewed and interpreted by me which does show nondisplaced fracture of the distal second through fourth metatarsals.  Radiology report agrees.  She has no open wounds.  Cam boot applied.  She will need to follow-up with podiatry as well as ENT.  She was given meclizine in the ED and feels better, will also prescribe scopolamine patch as needed.      ____________________________________________   FINAL CLINICAL IMPRESSION(S) / ED DIAGNOSES    Final diagnoses:  Peripheral vertigo involving right ear  Nondisplaced fracture of second metatarsal bone, right foot, initial encounter for closed fracture     ED Discharge Orders         Ordered    meclizine (ANTIVERT) 25 MG tablet  3 times daily PRN        09/15/20 2259    scopolamine  (TRANSDERM-SCOP, 1.5 MG,) 1 MG/3DAYS  every 72 hours        09/15/20 2259          Portions of this note were generated with dragon dictation software. Dictation errors may occur despite best attempts at proofreading.   Sharman Cheek, MD 09/15/20 2306

## 2020-09-15 NOTE — ED Notes (Signed)
Patient to stat desk, asking to change her complaint from nausea, vomiting and dizziness to just foot pain.  Explained that I would be happy to talk to a provider. Explained to patient that provider would probably not be okay doing this because the issue actually seems to be the medical complaints that caused the fall and foot pain and she would need a complete exam.  Patient verbalized understanding.

## 2020-09-15 NOTE — ED Notes (Signed)
Dr. Scotty Court, MD at bedside to evaluate pt at this time.

## 2020-09-15 NOTE — ED Triage Notes (Signed)
Pt comes pov with nausea, vomiting, dizziness, and multiple falls for about 3 days. States she's had nausea for about 3 years and they can't find an explanation.

## 2020-09-15 NOTE — Discharge Instructions (Signed)
Your CT scan of the head was normal.

## 2020-11-15 ENCOUNTER — Other Ambulatory Visit: Payer: Self-pay | Admitting: Physician Assistant

## 2020-11-15 DIAGNOSIS — M542 Cervicalgia: Secondary | ICD-10-CM

## 2020-11-29 ENCOUNTER — Ambulatory Visit: Payer: BLUE CROSS/BLUE SHIELD

## 2020-12-13 ENCOUNTER — Ambulatory Visit: Payer: BLUE CROSS/BLUE SHIELD

## 2020-12-31 ENCOUNTER — Ambulatory Visit: Payer: BLUE CROSS/BLUE SHIELD

## 2021-02-14 ENCOUNTER — Other Ambulatory Visit: Payer: Self-pay

## 2021-02-14 ENCOUNTER — Emergency Department: Payer: BLUE CROSS/BLUE SHIELD

## 2021-02-14 ENCOUNTER — Emergency Department
Admission: EM | Admit: 2021-02-14 | Discharge: 2021-02-14 | Disposition: A | Discharge status: Home / Self Care | Payer: BLUE CROSS/BLUE SHIELD | Attending: Emergency Medicine | Admitting: Emergency Medicine

## 2021-02-14 DIAGNOSIS — W19XXXA Unspecified fall, initial encounter: Secondary | ICD-10-CM | POA: Insufficient documentation

## 2021-02-14 DIAGNOSIS — F17210 Nicotine dependence, cigarettes, uncomplicated: Secondary | ICD-10-CM | POA: Diagnosis not present

## 2021-02-14 DIAGNOSIS — S92341A Displaced fracture of fourth metatarsal bone, right foot, initial encounter for closed fracture: Secondary | ICD-10-CM | POA: Diagnosis not present

## 2021-02-14 DIAGNOSIS — S92351A Displaced fracture of fifth metatarsal bone, right foot, initial encounter for closed fracture: Secondary | ICD-10-CM | POA: Diagnosis not present

## 2021-02-14 DIAGNOSIS — M79652 Pain in left thigh: Secondary | ICD-10-CM | POA: Insufficient documentation

## 2021-02-14 DIAGNOSIS — I10 Essential (primary) hypertension: Secondary | ICD-10-CM | POA: Diagnosis not present

## 2021-02-14 DIAGNOSIS — S99921A Unspecified injury of right foot, initial encounter: Secondary | ICD-10-CM | POA: Diagnosis present

## 2021-02-14 DIAGNOSIS — J45909 Unspecified asthma, uncomplicated: Secondary | ICD-10-CM | POA: Diagnosis not present

## 2021-02-14 NOTE — Discharge Instructions (Addendum)
Follow-up with Dr. Ether Griffins.  Please call for an appointment.  Return emergency department worsening.  Use crutches and do not bear weight on the right foot.

## 2021-02-14 NOTE — ED Triage Notes (Signed)
Pt states she had a fall and now her right foot is hurting- pt states she cannot put pressure on it- pt R ankle swollen

## 2021-02-14 NOTE — ED Provider Notes (Signed)
[facility] Emergency Department Provider Note  ____________________________________________   Event Date/Time   First MD Initiated Contact with Patient 02/14/21 1624     (approximate)  I have reviewed the triage vital signs and the nursing notes.   HISTORY  Chief Complaint Foot Pain    HPI Janet Kemp is a 55 y.o. female presents emergency department after a fall on Tuesday.  Patient is complaining of right foot and left thigh pain.  History of a fracture to the same foot.  No other injuries reported    Past Medical History:  Diagnosis Date  . Asthma   . Back pain   . Breast cyst, left   . Collagen vascular disease (HCC)   . Hypertension   . Lumbago   . Menopause     Patient Active Problem List   Diagnosis Date Noted  . Discharge from left nipple 09/23/2018  . Trichomoniasis 04/15/2017  . 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 Dyspareunia in female 04/13/2017  . Breast cyst, left 04/13/2017    Past Surgical History:  Procedure Laterality Date  . CESAREAN SECTION    . CHOLECYSTECTOMY    . ESOPHAGOGASTRODUODENOSCOPY (EGD) WITH PROPOFOL N/A 11/19/2015   Procedure: ESOPHAGOGASTRODUODENOSCOPY (EGD) WITH PROPOFOL;  Surgeon: Elnita Maxwell, MD;  Location: [facility] ENDOSCOPY;  Service: Endoscopy;  Laterality: N/A;  . LAPAROSCOPIC ASSISTED VAGINAL HYSTERECTOMY  2007    Prior to Admission medications   Medication Sig Start Date End Date Taking? Authorizing Provider  albuterol (PROVENTIL HFA;VENTOLIN HFA) 108 (90 Base) MCG/ACT inhaler Inhale 2 puffs into the lungs every 4 (four) hours as needed for wheezing or shortness of breath. 08/08/17   Irean Hong, MD  ALPRAZolam Prudy Feeler) 1 MG tablet Take 1 mg by mouth 2 (two) times daily. 04/09/17   [provider]  azithromycin (ZITHROMAX) 250 MG tablet Take 1 tablet (250 mg total) by mouth daily. Patient not taking: Reported on 09/23/2018 08/08/17   Irean Hong, MD  baclofen (LIORESAL) 10 MG  tablet Take by mouth.    [provider]  benzonatate (TESSALON) 200 MG capsule Take 1 capsule (200 mg total) by mouth 3 (three) times daily as needed for cough. Patient not taking: Reported on 09/23/2018 08/08/17   Irean Hong, MD  butalbital-acetaminophen-caffeine (FIORICET, ESGIC) [phone] MG tablet Take 1 tablet by mouth 3 (three) times daily as needed. 03/23/17   [provider]  estradiol (ESTRACE VAGINAL) 0.1 MG/GM vaginal cream 2g qhs intravaginally for 2 weeks, then reduce to 1g qhs for 1 week, then followed by a maintenance dose of 1 g 2 times per week. Also apply some externally when applying vaginally Patient not taking: Reported on 09/23/2018 04/20/17   [place] Bing, MD  estradiol (ESTRING) 2 MG vaginal ring Place 2 mg vaginally every 3 (three) months. follow package directions    [provider]  ibuprofen (ADVIL,MOTRIN) 200 MG tablet Take 200-800 mg by mouth every 6 (six) hours as needed (pain).    [provider]  meclizine (ANTIVERT) 25 MG tablet Take 1 tablet (25 mg total) by mouth 3 (three) times daily as needed for dizziness or nausea. 09/15/20   Sharman Cheek, MD  metroNIDAZOLE (FLAGYL) 500 MG tablet Take two tablets by mouth twice a day, for one day.  Or you can take all four tablets at once if you can tolerate it. Patient not taking: Reported on 09/23/2018 04/16/17   Geneva Bing, MD  morphine (MS CONTIN) 30 MG 12 hr tablet  Take by mouth.    [provider]  omeprazole (PRILOSEC) 40 MG capsule Take 40 mg by mouth daily.    [provider]  ondansetron (ZOFRAN ODT) 4 MG disintegrating tablet Take 1 tablet (4 mg total) by mouth every 8 (eight) hours as needed for nausea or vomiting. 08/08/17   Irean Hong, MD  oxyCODONE-acetaminophen (PERCOCET) 10-325 MG tablet Take by mouth.    [provider]  predniSONE (DELTASONE) 50 MG tablet Take one 50 mg tablet once daily for the next 5 days. 01/14/18   Orvil Feil, PA-C   scopolamine (TRANSDERM-SCOP, 1.5 MG,) 1 MG/3DAYS Place 1 patch (1.5 mg total) onto the skin every 3 (three) days. 09/15/20 09/15/21  Sharman Cheek, MD  Vitamin D, Ergocalciferol, (DRISDOL) 50000 units CAPS capsule Take by mouth.    [provider]    Allergies Cymbalta [duloxetine hcl] and Codeine  Family History  Problem Relation Age of Onset  . Pneumonia Mother   . Uterine cancer Mother     Social History Social History   Tobacco Use  . Smoking status: Current Every Day Smoker    Packs/day: 0.50    Years: 34.00    Pack years: 17.00    Types: Cigarettes  . Smokeless tobacco: Never Used  Substance Use Topics  . Alcohol use: No  . Drug use: Yes    Types: Marijuana    Review of Systems  Constitutional: No fever/chills Eyes: No visual changes. ENT: No sore throat. Respiratory: Denies cough Cardiovascular: Denies chest pain Gastrointestinal: Denies abdominal pain Genitourinary: Negative for dysuria. Musculoskeletal: Negative for back pain.  Positive for right foot and left leg pain Skin: Negative for rash. Psychiatric: no mood changes,     ____________________________________________   PHYSICAL EXAM:  VITAL SIGNS: ED Triage Vitals  Enc Vitals Group     BP 02/14/21 1526 109/64     Pulse Rate 02/14/21 1526 71     Resp 02/14/21 1526 16     Temp 02/14/21 1526 98.4 F (36.9 C)     Temp Source 02/14/21 1526 Oral     SpO2 02/14/21 1526 96 %     Weight 02/14/21 1529 130 lb (59 kg)     Height 02/14/21 1529 5\' 3"  (1.6 m)     Head Circumference --      Peak Flow --      Pain Score 02/14/21 1529 9     Pain Loc --      Pain Edu? --      Excl. in GC? --     Constitutional: Alert and oriented. Well appearing and in no acute distress. Eyes: Conjunctivae are normal.  Head: Atraumatic. Nose: No congestion/rhinnorhea. Mouth/Throat: Mucous membranes are moist.   Neck:  supple no lymphadenopathy noted Cardiovascular: Normal rate, regular rhythm.   Respiratory: Normal respiratory effort.  No retractions,  GU: deferred Musculoskeletal: FROM all extremities, warm and well perfused, right foot is swollen and tender distally, neurovascular is intact, left thigh has a bruise, no bony tenderness appreciated Neurologic:  Normal speech and language.  Skin:  Skin is warm, dry and intact. No rash noted. Psychiatric: Mood and affect are normal. Speech and behavior are normal.  ____________________________________________   LABS (all labs ordered are listed, but only abnormal results are displayed)  Labs Reviewed - No data to display ____________________________________________   ____________________________________________  RADIOLOGY  X-ray of the right foot  ____________________________________________   PROCEDURES  Procedure(s) performed: Posterior OCL applied by nursing staff  Procedures    ____________________________________________   INITIAL IMPRESSION / ASSESSMENT AND PLAN / ED COURSE  Pertinent labs & imaging results that were available during my care of the patient were reviewed by me and considered in my medical decision making (see chart for details).   Patient is 55 year old female presents with left foot pain.  See HPI.  Physical exam shows patient appears stable.  X-ray of the left foot reviewed by me confirmed by radiology shows fractures along the right third fourth and fifth distal metatarsals  Patient states she has history of fracture along the same area.  She does have crutches at home.  She will be placed in a posterior OCL and instructed to follow-up with podiatry at [facility] clinic as this is her regular physician.  She was discharged in stable condition.  She does not want pain medication she has pain medicines at home.     Syble Picco Nienaber was evaluated in Emergency Department on 02/14/2021 for the symptoms described in the history of present illness. She was evaluated in the context of the global  COVID-19 pandemic, which necessitated consideration that the patient might be at risk for infection with the SARS-CoV-2 virus that causes COVID-19. Institutional protocols and algorithms that pertain to the evaluation of patients at risk for COVID-19 are in a state of rapid change based on information released by regulatory bodies including the CDC and federal and state organizations. These policies and algorithms were followed during the patient's care in the ED.    As part of my medical decision making, I reviewed the following data within the electronic MEDICAL RECORD NUMBER Nursing notes reviewed and incorporated, Old chart reviewed, Radiograph reviewed , Notes from prior ED visits and [place] Controlled Substance Database  ____________________________________________   FINAL CLINICAL IMPRESSION(S) / ED DIAGNOSES  Final diagnoses:  Fall  Closed displaced fracture of fourth metatarsal bone of right foot, initial encounter  Closed displaced fracture of fifth metatarsal bone of right foot, initial encounter      NEW MEDICATIONS STARTED DURING THIS VISIT:  New Prescriptions   No medications on file     Note:  This document was prepared using Dragon voice recognition software and may include unintentional dictation errors.    Faythe Ghee, PA-C 02/14/21 1633    Shaune Pollack, MD 02/20/21 [phone]

## 2021-05-09 ENCOUNTER — Other Ambulatory Visit (HOSPITAL_COMMUNITY): Payer: Self-pay | Admitting: Family Medicine

## 2021-05-09 ENCOUNTER — Other Ambulatory Visit: Payer: Self-pay | Admitting: Family Medicine

## 2021-05-09 DIAGNOSIS — R519 Headache, unspecified: Secondary | ICD-10-CM

## 2021-05-09 DIAGNOSIS — R42 Dizziness and giddiness: Secondary | ICD-10-CM

## 2021-05-09 DIAGNOSIS — R110 Nausea: Secondary | ICD-10-CM

## 2021-06-03 IMAGING — CR DG FOOT COMPLETE 3+V*R*
1 series · 3 of 3 positions shown · non-contrast
Comparison: None.

CLINICAL DATA: Right foot pain after fall.

EXAM:
RIGHT FOOT COMPLETE - 3+ VIEW

[Series 1: dg foot complete right · 0.14mm/px · 3 of 3 slices shown]
[im 1/3]
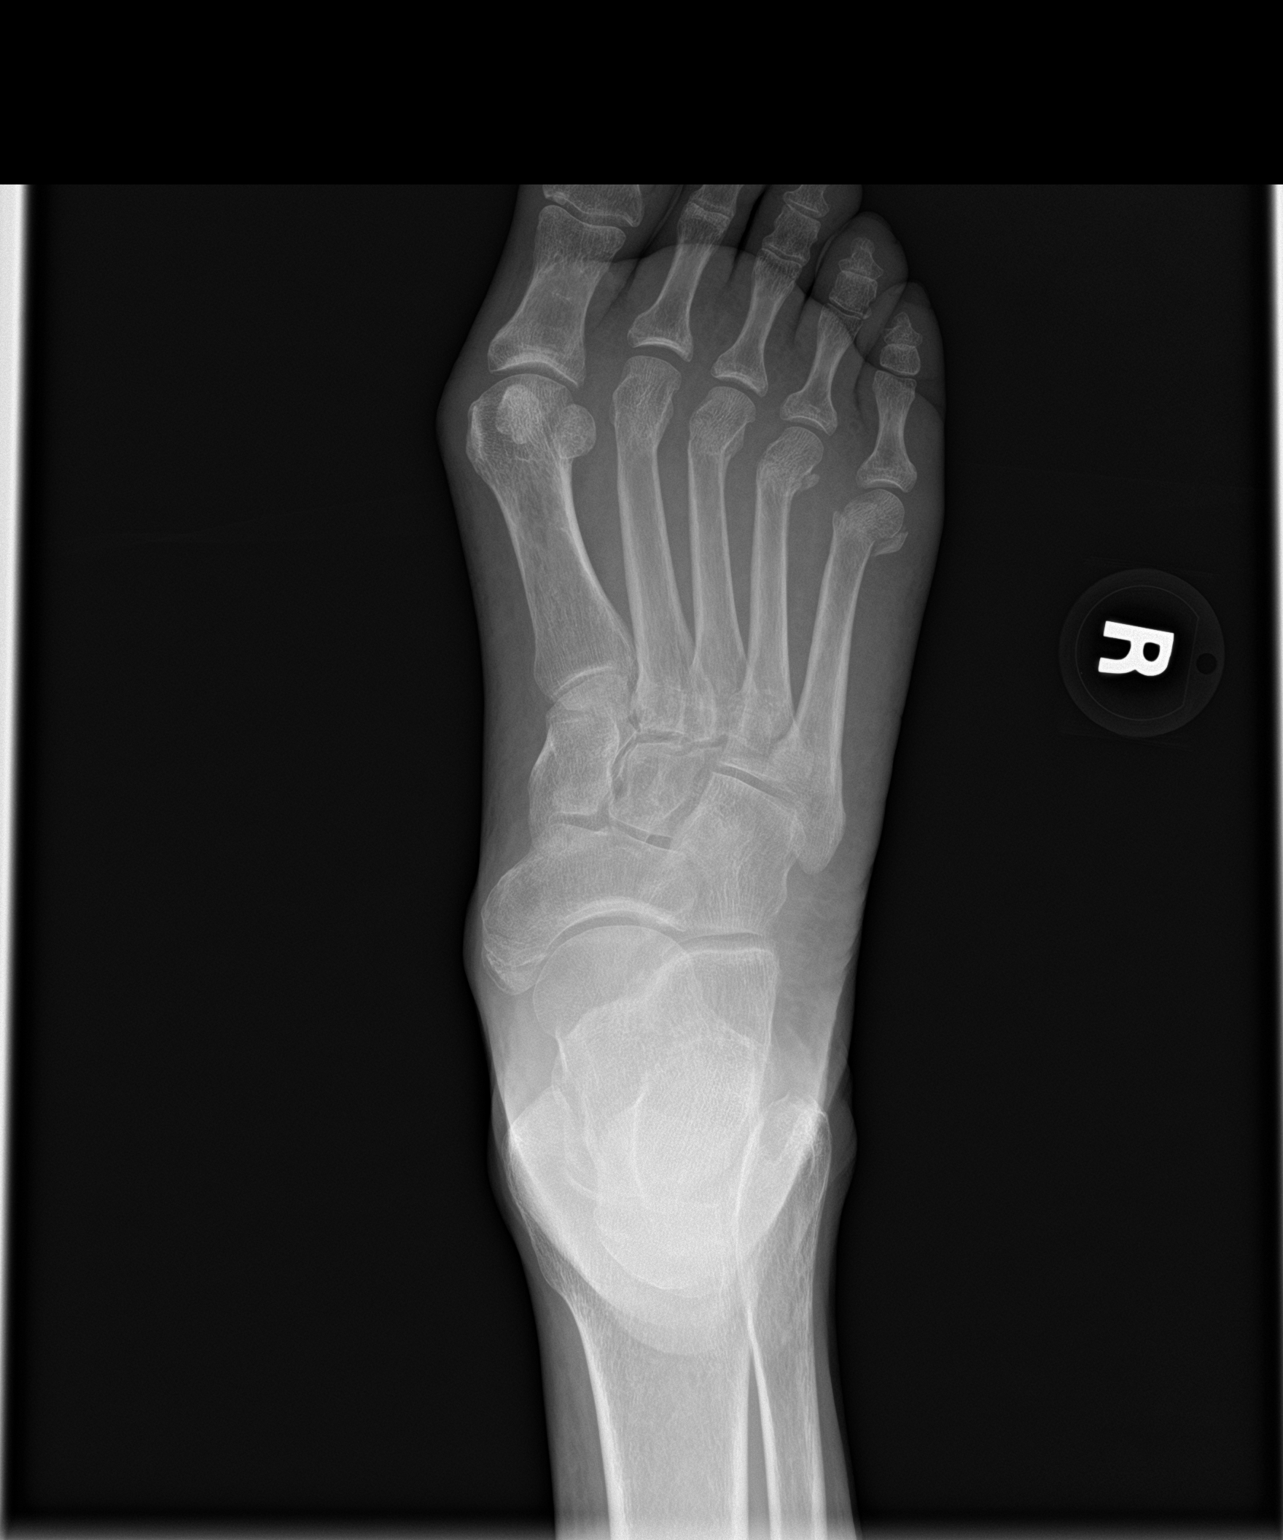
[im 2/3]
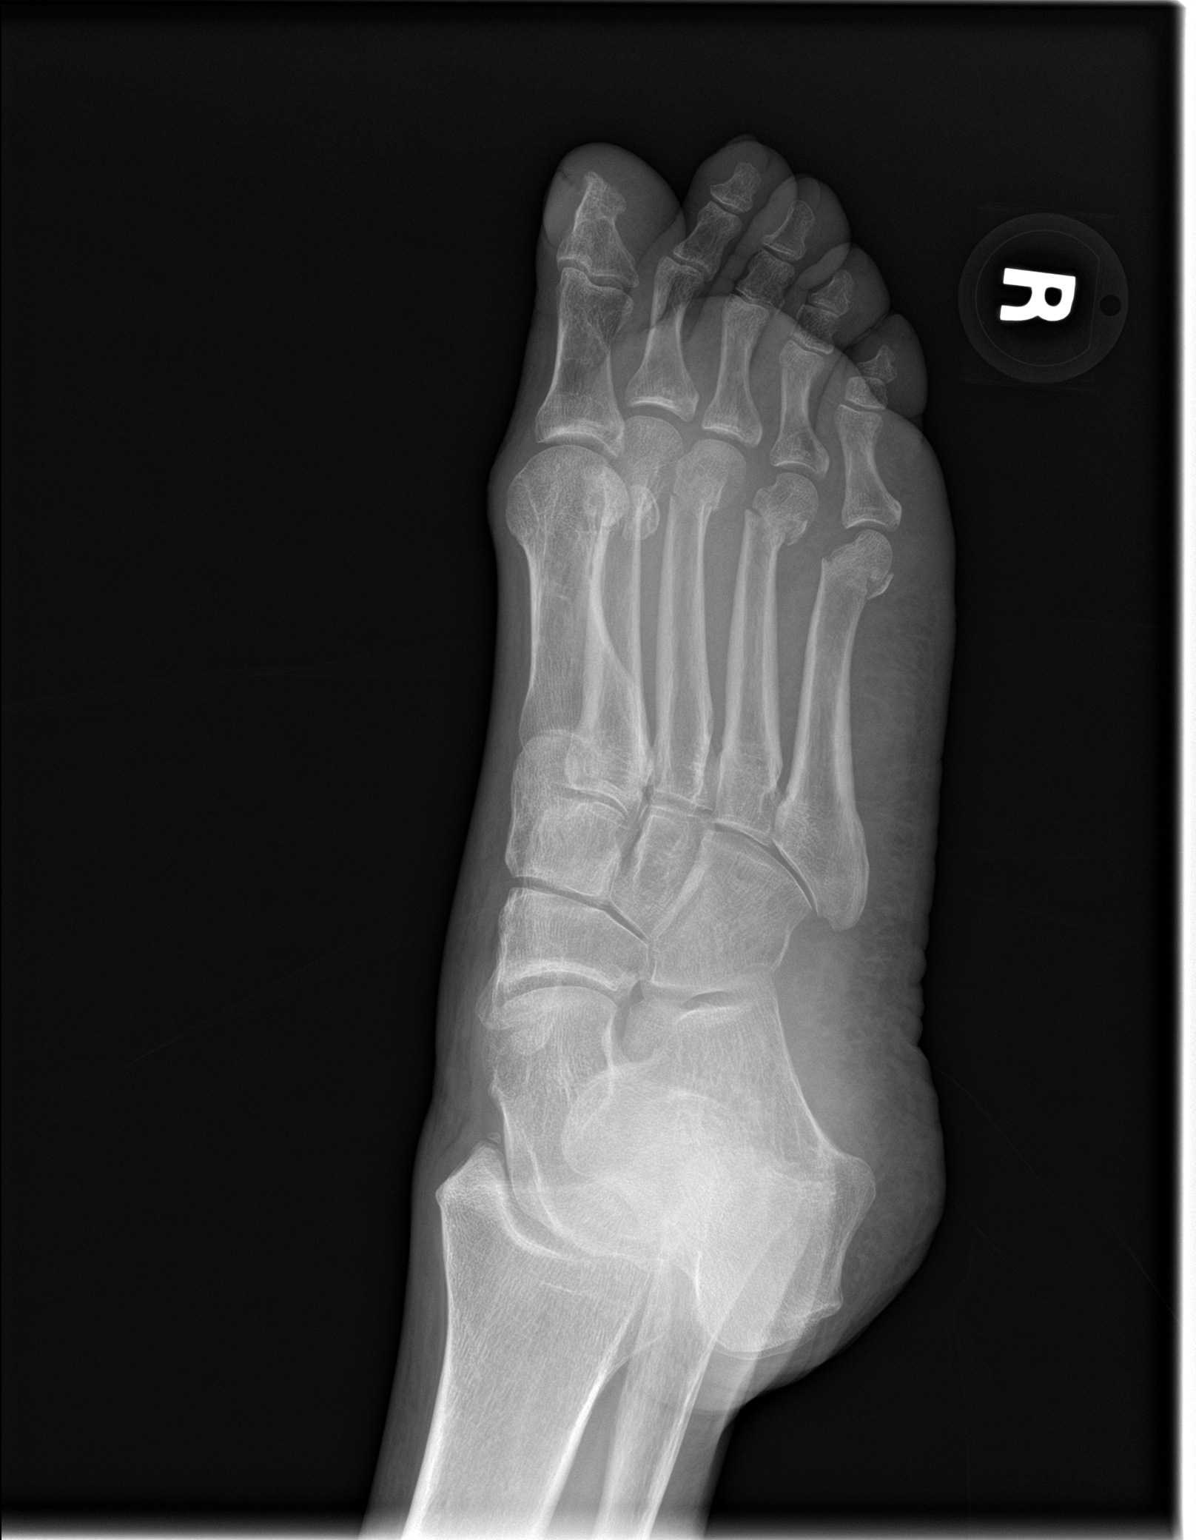
[im 3/3]
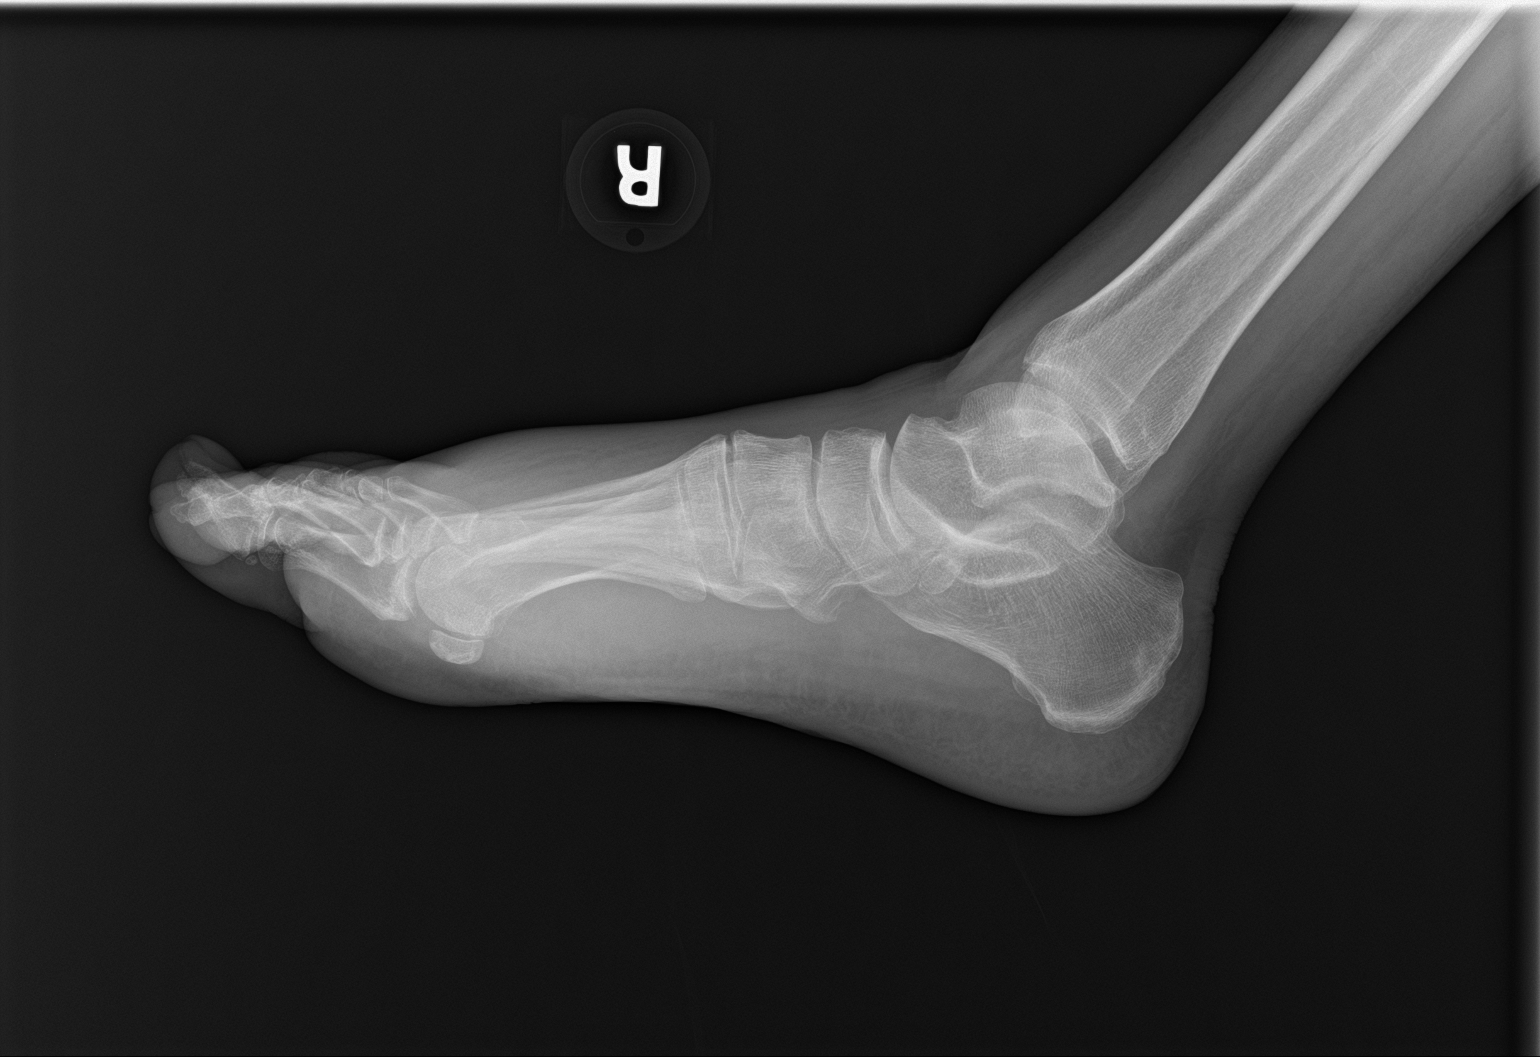

[3 of 3 positions shown; findings below may reference images not displayed]

FINDINGS: Mildly displaced and probably comminuted fractures are seen
involving the distal portions of the third, fourth and fifth
metatarsals. Mild dorsal soft tissue swelling is noted. Joint spaces
are intact.
IMPRESSION: Mildly displaced and probably comminuted fractures are seen
involving the distal portions of the third, fourth and fifth
metatarsals.

## 2021-08-06 ENCOUNTER — Emergency Department
Admission: EM | Admit: 2021-08-06 | Discharge: 2021-08-06 | Discharge status: Against Medical Advice | Payer: BLUE CROSS/BLUE SHIELD | Source: Home / Self Care

## 2021-11-18 ENCOUNTER — Emergency Department: Payer: Medicare Other

## 2021-11-18 ENCOUNTER — Encounter: Payer: Self-pay | Admitting: Emergency Medicine

## 2021-11-18 ENCOUNTER — Emergency Department
Admission: EM | Admit: 2021-11-18 | Discharge: 2021-11-19 | Disposition: A | Discharge status: Home / Self Care | Payer: Medicare Other | Attending: Emergency Medicine | Admitting: Emergency Medicine

## 2021-11-18 ENCOUNTER — Other Ambulatory Visit: Payer: Self-pay

## 2021-11-18 DIAGNOSIS — S92414A Nondisplaced fracture of proximal phalanx of right great toe, initial encounter for closed fracture: Secondary | ICD-10-CM | POA: Diagnosis not present

## 2021-11-18 DIAGNOSIS — M6282 Rhabdomyolysis: Secondary | ICD-10-CM | POA: Diagnosis not present

## 2021-11-18 DIAGNOSIS — S0083XA Contusion of other part of head, initial encounter: Secondary | ICD-10-CM | POA: Diagnosis not present

## 2021-11-18 DIAGNOSIS — D72829 Elevated white blood cell count, unspecified: Secondary | ICD-10-CM | POA: Insufficient documentation

## 2021-11-18 DIAGNOSIS — R748 Abnormal levels of other serum enzymes: Secondary | ICD-10-CM | POA: Diagnosis not present

## 2021-11-18 DIAGNOSIS — N390 Urinary tract infection, site not specified: Secondary | ICD-10-CM | POA: Insufficient documentation

## 2021-11-18 DIAGNOSIS — S99921A Unspecified injury of right foot, initial encounter: Secondary | ICD-10-CM | POA: Diagnosis present

## 2021-11-18 DIAGNOSIS — J45909 Unspecified asthma, uncomplicated: Secondary | ICD-10-CM | POA: Diagnosis not present

## 2021-11-18 DIAGNOSIS — I10 Essential (primary) hypertension: Secondary | ICD-10-CM | POA: Insufficient documentation

## 2021-11-18 DIAGNOSIS — S20212A Contusion of left front wall of thorax, initial encounter: Secondary | ICD-10-CM | POA: Insufficient documentation

## 2021-11-18 DIAGNOSIS — W01198A Fall on same level from slipping, tripping and stumbling with subsequent striking against other object, initial encounter: Secondary | ICD-10-CM | POA: Insufficient documentation

## 2021-11-18 LAB — CBC WITH DIFFERENTIAL/PLATELET
Abs Immature Granulocytes: 0.04 10*3/uL (ref 0.00–0.07)
Basophils Absolute: 0.1 10*3/uL (ref 0.0–0.1)
Basophils Relative: 1 %
Eosinophils Absolute: 0 10*3/uL (ref 0.0–0.5)
Eosinophils Relative: 0 %
HCT: 46 % (ref 36.0–46.0)
Hemoglobin: 15.2 g/dL — ABNORMAL HIGH (ref 12.0–15.0)
Immature Granulocytes: 0 %
Lymphocytes Relative: 18 %
Lymphs Abs: 2.2 10*3/uL (ref 0.7–4.0)
MCH: 29.9 pg (ref 26.0–34.0)
MCHC: 33 g/dL (ref 30.0–36.0)
MCV: 90.4 fL (ref 80.0–100.0)
Monocytes Absolute: 1 10*3/uL (ref 0.1–1.0)
Monocytes Relative: 8 %
Neutro Abs: 9.1 10*3/uL — ABNORMAL HIGH (ref 1.7–7.7)
Neutrophils Relative %: 73 %
Platelets: 214 10*3/uL (ref 150–400)
RBC: 5.09 MIL/uL (ref 3.87–5.11)
RDW: 13.6 % (ref 11.5–15.5)
WBC: 12.4 10*3/uL — ABNORMAL HIGH (ref 4.0–10.5)
nRBC: 0 % (ref 0.0–0.2)

## 2021-11-18 LAB — URINALYSIS, ROUTINE W REFLEX MICROSCOPIC
Bilirubin Urine: NEGATIVE
Glucose, UA: NEGATIVE mg/dL
Ketones, ur: NEGATIVE mg/dL
Nitrite: NEGATIVE
Protein, ur: NEGATIVE mg/dL
Specific Gravity, Urine: 1.006 (ref 1.005–1.030)
WBC, UA: >50 WBC/hpf — ABNORMAL HIGH (ref 0–5)
pH: 7 (ref 5.0–8.0)

## 2021-11-18 LAB — BASIC METABOLIC PANEL
Anion gap: 10 (ref 5–15)
BUN: <5 mg/dL — ABNORMAL LOW (ref 6–20)
CO2: 33 mmol/L — ABNORMAL HIGH (ref 22–32)
Calcium: 9.3 mg/dL (ref 8.9–10.3)
Chloride: 97 mmol/L — ABNORMAL LOW (ref 98–111)
Creatinine, Ser: 0.77 mg/dL (ref 0.44–1.00)
GFR, Estimated: >60 mL/min (ref >60)
Glucose, Bld: 146 mg/dL — ABNORMAL HIGH (ref 70–99)
Potassium: 3 mmol/L — ABNORMAL LOW (ref 3.5–5.1)
Sodium: 140 mmol/L (ref 135–145)

## 2021-11-18 LAB — CK: Total CK: 449 U/L — ABNORMAL HIGH (ref 38–234)

## 2021-11-18 MED ORDER — CEPHALEXIN 500 MG PO CAPS
500.0000 mg | ORAL_CAPSULE | Freq: Once | ORAL | Status: AC
Start: 2021-11-18 — End: 2021-11-18
  Administered 2021-11-18: 500 mg via ORAL
  Filled 2021-11-18: qty 1

## 2021-11-18 MED ORDER — OXYCODONE-ACETAMINOPHEN 5-325 MG PO TABS
1.0000 | ORAL_TABLET | Freq: Once | ORAL | Status: AC
Start: 2021-11-18 — End: 2021-11-18
  Administered 2021-11-18: 1 via ORAL
  Filled 2021-11-18: qty 1

## 2021-11-18 MED ORDER — CEPHALEXIN 500 MG PO CAPS: 500.0000 mg | ORAL_CAPSULE | Freq: Two times a day (BID) | ORAL | 0 refills | Status: AC

## 2021-11-18 MED ORDER — SODIUM CHLORIDE 0.9 % IV BOLUS
1000.0000 mL | Freq: Once | INTRAVENOUS | Status: AC
  Administered 2021-11-18: 1000 mL via INTRAVENOUS

## 2021-11-18 NOTE — ED Triage Notes (Signed)
Pt to ED from [facility]. Pt states that she fell multiple times on Saturday night. Pt has bruising to face, bilateral eye swelling and bruising. Pt states that she is also sore in her chest, bilateral knees, back, and right foot. Pt does not take blood thinners. ?

## 2021-11-18 NOTE — Discharge Instructions (Addendum)
Take your normal pain medication as prescribed.  Take the antibiotic as prescribed and finish the full 7-day course. ? ?Follow-up with a podiatrist keep the boot on at all times when putting weight on the right foot until you follow-up. ? ?Return to the ER for new or worsening pain, swelling, weakness or lightheadedness, recurrent falls, or any other new or worsening symptoms that concern you. ?

## 2021-11-18 NOTE — ED Notes (Signed)
ED Provider at bedside. 

## 2021-11-18 NOTE — ED Provider Notes (Signed)
? ?[facility] ?Provider Note ? ? ? Event Date/Time  ? First MD Initiated Contact with Patient 11/18/21 2112   ?  (approximate) ? ? ?History  ? ?Fall ? ? ?HPI ? ?Janet Kemp is a 56 y.o. female with history of asthma, hypertension, collagen vascular disease, and vertigo who presents with facial, rib, and foot injuries after multiple falls 2 days ago.  The patient states that she had a bout of vertigo and fell multiple times over several hours during the evening 2 days ago.  She denies feeling lightheaded, near syncopal, or losing consciousness.  She states that she hit her head and face multiple times and also injured her right great toe and her ribs on the left side.  The patient states that she has had similar episodes where she will get vertigo and then fall multiple times.  However, the vertigo has now resolved and she denies any ongoing dizziness, lightheadedness, weakness, or recurrent falls.  She does endorse some dysuria that started in the last 2 days. ? ? ?Physical Exam  ? ?Triage Vital Signs: ?ED Triage Vitals  ?Enc Vitals Group  ?   BP 11/18/21 1540 (!) 160/94  ?   Pulse Rate 11/18/21 1540 71  ?   Resp 11/18/21 1540 18  ?   Temp 11/18/21 1540 98.6 ?F (37 ?C)  ?   Temp Source 11/18/21 1540 Oral  ?   SpO2 11/18/21 1540 98 %  ?   Weight 11/18/21 1542 126 lb (57.2 kg)  ?   Height 11/18/21 1542 5\' 3"  (1.6 m)  ?   Head Circumference --   ?   Peak Flow --   ?   Pain Score 11/18/21 1534 8  ?   Pain Loc --   ?   Pain Edu? --   ?   Excl. in GC? --   ? ? ?Most recent vital signs: ?Vitals:  ? 11/18/21 1749 11/18/21 2210  ?BP: (!) 134/96 129/89  ?Pulse: 64 66  ?Resp: 16 17  ?Temp:  98.2 ?F (36.8 ?C)  ?SpO2: 96% 97%  ? ? ? ?General: Alert, comfortable appearing, no distress. ?CV:  Good peripheral perfusion.  ?Resp:  Normal effort.  ?Abd:  No distention.  ?Other:  Significant bruising to bilateral face with no significant periorbital swelling.  No focal bony tenderness.  EOMI.  Bruising and  tenderness to the base of the right great toe.  Mild tenderness to the left anterior lateral ribs with no step-off or crepitus. ? ? ?ED Results / Procedures / Treatments  ? ?Labs ?(all labs ordered are listed, but only abnormal results are displayed) ?Labs Reviewed  ?CK - Abnormal; Notable for the following components:  ?    Result Value  ? Total CK 449 (*)   ? All other components within normal limits  ?BASIC METABOLIC PANEL - Abnormal; Notable for the following components:  ? Potassium 3.0 (*)   ? Chloride 97 (*)   ? CO2 33 (*)   ? Glucose, Bld 146 (*)   ? BUN <5 (*)   ? All other components within normal limits  ?CBC WITH DIFFERENTIAL/PLATELET - Abnormal; Notable for the following components:  ? WBC 12.4 (*)   ? Hemoglobin 15.2 (*)   ? Neutro Abs 9.1 (*)   ? All other components within normal limits  ?URINALYSIS, ROUTINE W REFLEX MICROSCOPIC - Abnormal; Notable for the following components:  ? Color, Urine YELLOW (*)   ? APPearance HAZY (*)   ?  Hgb urine dipstick MODERATE (*)   ? Leukocytes,Ua LARGE (*)   ? WBC, UA >50 (*)   ? Bacteria, UA MANY (*)   ? All other components within normal limits  ? ? ? ?EKG ? ? ? ? ?RADIOLOGY ? ?CT head: I independently viewed and interpreted the images; there is no ICH or other acute traumatic findings ? ?CT cervical spine: I independently viewed and interpreted the images; there is no evidence of fracture.  Radiology report confirms no acute traumatic findings. ? ?CT maxillofacial: Per radiology report, soft tissue swelling with no evidence of acute facial fracture ? ?XR L ribs and chest: I independently viewed and interpreted the images; there is no acute fracture ? ?XR R foot: I independently viewed and interpreted the images; there is a nondisplaced but angulated fracture of the proximal phalanx of the great toe ? ?PROCEDURES: ? ?Critical Care performed: No ? ?Procedures ? ? ?MEDICATIONS ORDERED IN ED: ?Medications  ?cephALEXin (KEFLEX) capsule 500 mg (has no administration in  time range)  ?oxyCODONE-acetaminophen (PERCOCET/ROXICET) 5-325 MG per tablet 1 tablet (1 tablet Oral Given 11/18/21 2210)  ?sodium chloride 0.9 % bolus 1,000 mL (0 mLs Intravenous Stopped 11/18/21 2242)  ? ? ? ?IMPRESSION / MDM / ASSESSMENT AND PLAN / ED COURSE  ?I reviewed the triage vital signs and the nursing notes. ? ?56 year old female with PMH as noted above presents with bruising and swelling to her face as well as pain and swelling to the right great toe and pain to the left ribs after multiple falls sustained a couple of days ago when she had a bout of vertigo.  Patient reports prior episodes like this, however the dizziness has subsequently resolved. ? ?On exam the patient has significant ecchymosis to both sides of the face and mild swelling.  There is some bruising to the base of the right great toe and some mild left rib tenderness.  Exam is otherwise unremarkable.  The patient overall appears well. ? ?CT head, cervical spine, and maxillofacial are all negative for acute traumatic findings other than soft tissue swelling to the face. ? ?Lab work-up is significant for mildly elevated CK indicating mild rhabdomyolysis.  The patient's creatinine is normal.  She has mild leukocytosis which is nonspecific and likely reactive given the patient's recent trauma. ? ?I reviewed the past medical records including the note from Dr. Burnadette Pop from the walk-in clinic seeing the patient earlier today.  She sent the patient in for this imaging.  She indicates in her note that the patient is unsafe going home at this time, however the patient states that she normally walks with a walker, has no ongoing dizziness, and does feel safe at home.  She would be taken home by her daughter tonight. ? ?In terms of the injuries, we will obtain an x-ray of the right foot and a left rib series to rule out acute fractures in these areas. ? ?For the mild rhabdomyolysis we will give a liter of fluid here.  Given that it is only a mild  elevation and the patient has a normal creatinine, there is no indication for prolonged monitoring or admission. ? ?In addition due to the dysuria we will obtain a UA. ? ?The patient states that she feels well to go home, and if this additional work-up is negative she will be appropriate for discharge. ? ?----------------------------------------- ?11:35 PM on 11/18/2021 ?----------------------------------------- ? ?Urinalysis is consistent with a UTI.  However the patient has no fever, flank pain,  vomiting, or clinical evidence of pyelonephritis or sepsis.  She is appropriate for outpatient treatment.  She has received a liter of fluids in the ED. ? ?X-ray of the right foot shows a fracture of the proximal phalanx of the great toe.  The patient already has a rigid cam boot on this foot, and I have instructed her to continue wearing it.  I have referred her to podiatry. ? ?At this time, the patient is stable for discharge home.  I counseled her on the results of the work-up and plan of care.  Return precautions given, and she expresses understanding. ? ? ?FINAL CLINICAL IMPRESSION(S) / ED DIAGNOSES  ? ?Final diagnoses:  ?Urinary tract infection without hematuria, site unspecified  ?Contusion of face, initial encounter  ?Nondisplaced fracture of proximal phalanx of right great toe, initial encounter for closed fracture  ? ? ? ?Rx / DC Orders  ? ?ED Discharge Orders   ? ?      Ordered  ?  cephALEXin (KEFLEX) 500 MG capsule  2 times daily       ? 11/18/21 2333  ? ?  ?  ? ?  ? ? ? ?Note:  This document was prepared using Dragon voice recognition software and may include unintentional dictation errors.  ?  Dionne Bucy, MD ?11/18/21 2337 ? ?

## 2021-11-18 NOTE — ED Provider Triage Note (Signed)
Emergency Medicine Provider Triage Evaluation Note ? ?Janet Kemp , a 56 y.o. female  was evaluated in triage.  Pt complains of facial pain and severe bilateral ecchymosis after a fall on Saturday.  Patient states that she was on the ground for several hours.  She denies chest pain, chest tightness or abdominal pain.  She is not able to articulate why she did not seek care prior to today. ? ?Review of Systems  ?Positive: Patient has facial pain and headache. ?Negative: No chest pain or abdominal pain. ? ?Physical Exam  ?BP (!) 160/94 (BP Location: Right Arm)   Pulse 71   Temp 98.6 ?F (37 ?C) (Oral)   Resp 18   Ht 5\' 3"  (1.6 m)   Wt 57.2 kg   SpO2 98%   BMI 22.32 kg/m?  ?Gen:   Awake, no distress   ?Resp:  Normal effort  ?MSK:   Moves extremities without difficulty  ?Other:   ? ?Medical Decision Making  ?Medically screening exam initiated at 3:42 PM.  Appropriate orders placed.  Janet Kemp was informed that the remainder of the evaluation will be completed by another provider, this initial triage assessment does not replace that evaluation, and the importance of remaining in the ED until their evaluation is complete. ? ? ?  ?Florence Canner [place], PA-C ?11/18/21 1543 ? ?

## 2022-03-07 IMAGING — CT CT HEAD W/O CM
4 series · 16 of 47 positions shown, 18 images · non-contrast
Comparison: CT head 09/15/2020

CLINICAL DATA: Facial trauma, blunt

EXAM:
CT HEAD WITHOUT CONTRAST
CT MAXILLOFACIAL WITHOUT CONTRAST
TECHNIQUE: Multidetector CT imaging of the head and maxillofacial structures
were performed using the standard protocol without intravenous
contrast. Multiplanar CT image reconstructions of the maxillofacial
structures were also generated.
RADIATION DOSE REDUCTION: This exam was performed according to the
departmental dose-optimization program which includes automated
exposure control, adjustment of the mA and/or kV according to
patient size and/or use of iterative reconstruction technique.

[Series 2: head wo · axial · 0.43mm/px · z∈[-112,+8]mm · 7 of 32 slices shown, 9 images]
[im 4/32  brain]
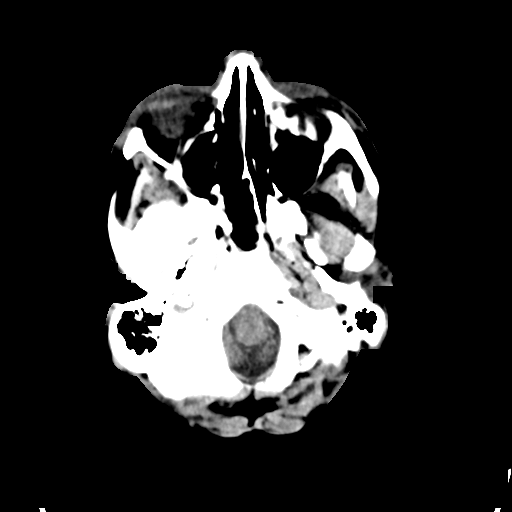
[im 4/32  bone]
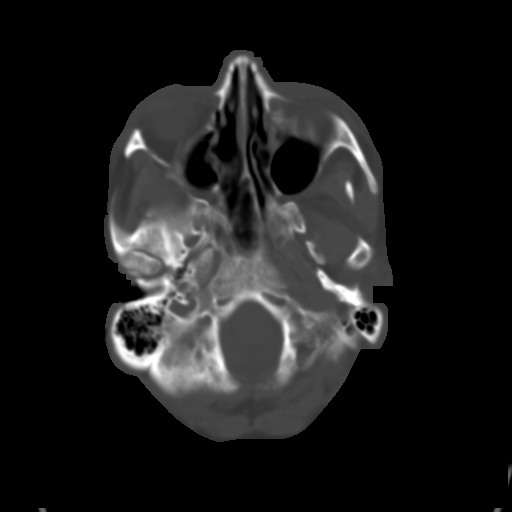
[im 8/32  brain]
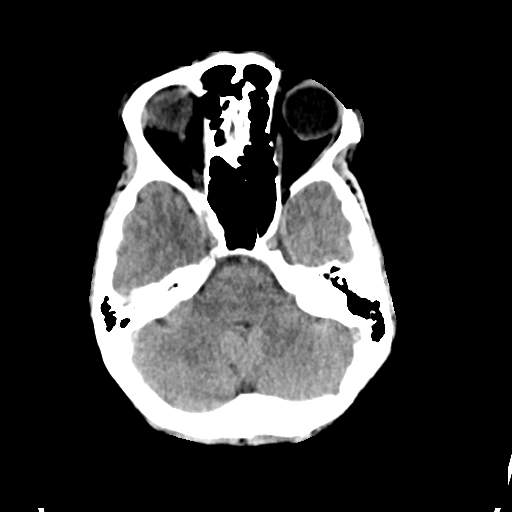
[im 12/32  brain]
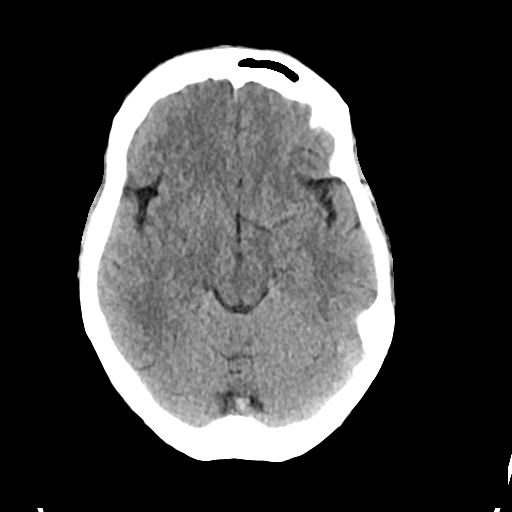
[im 16/32  brain]
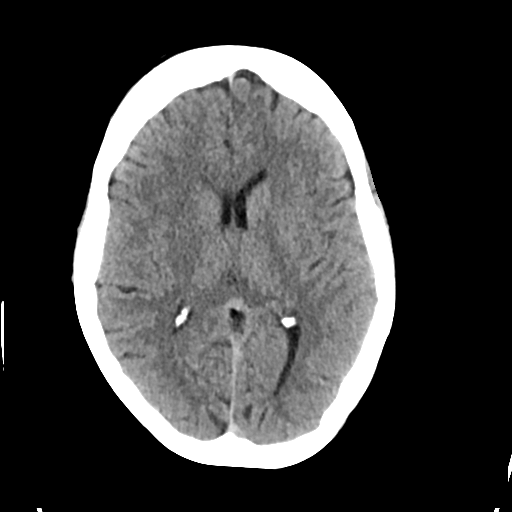
[im 20/32  brain]
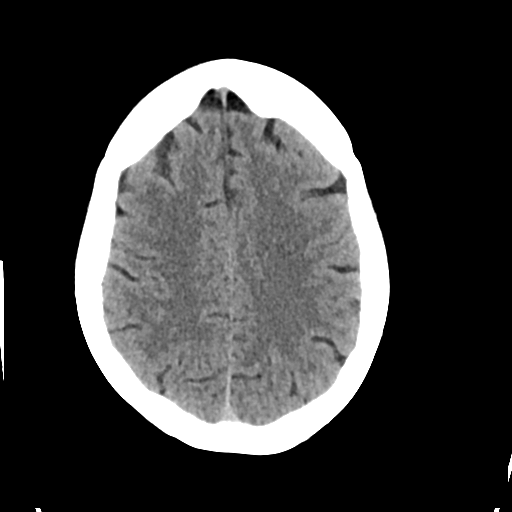
[im 20/32  bone]
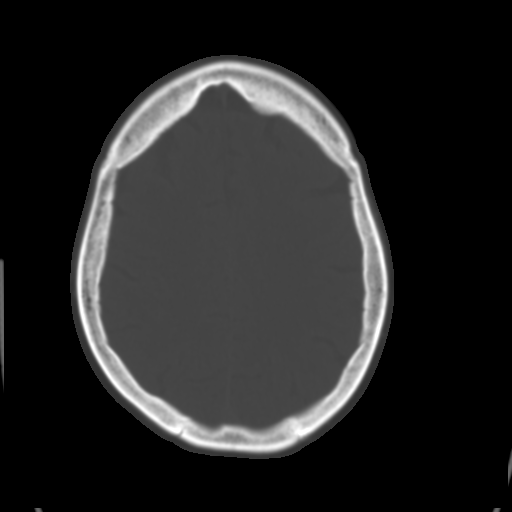
[im 24/32  brain]
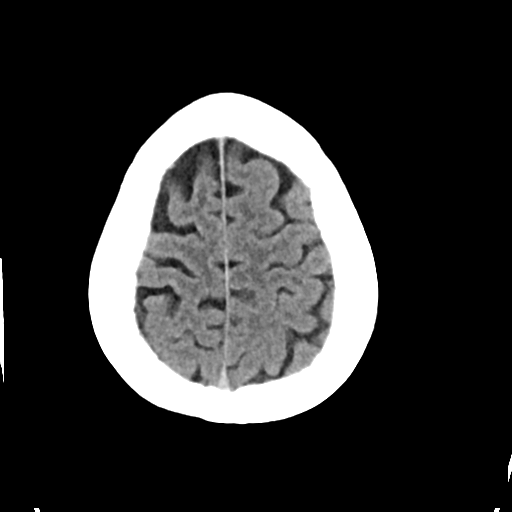
[im 28/32  brain]
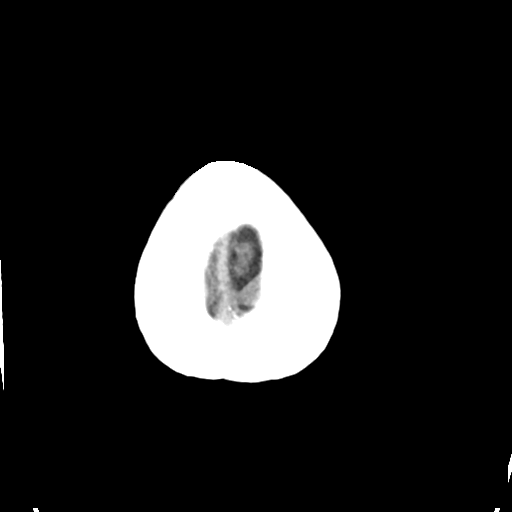

[Series 3: head bone · axial · 0.43mm/px · z∈[-113,-81]mm · 3 of 80 slices shown]
[im 8/80  bone]
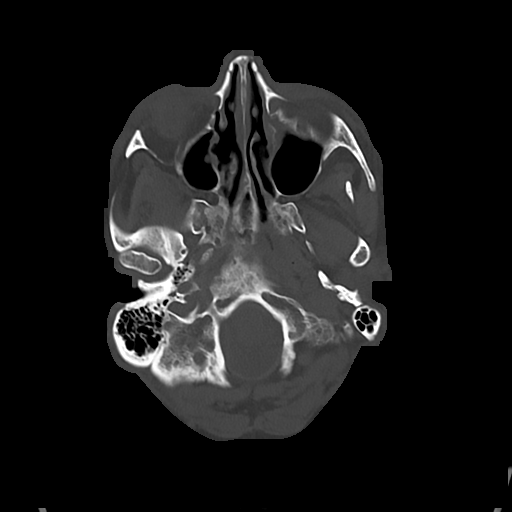
[im 16/80  bone]
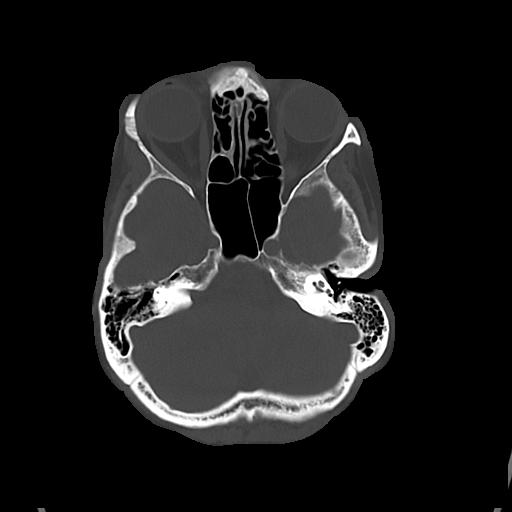
[im 24/80  bone]
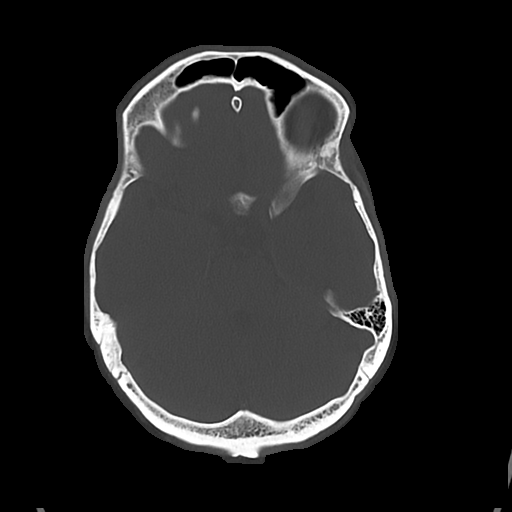

[Series 4: cor soft · coronal · 0.31mm/px · 3 of 66 slices shown]
[im 22/66  brain]
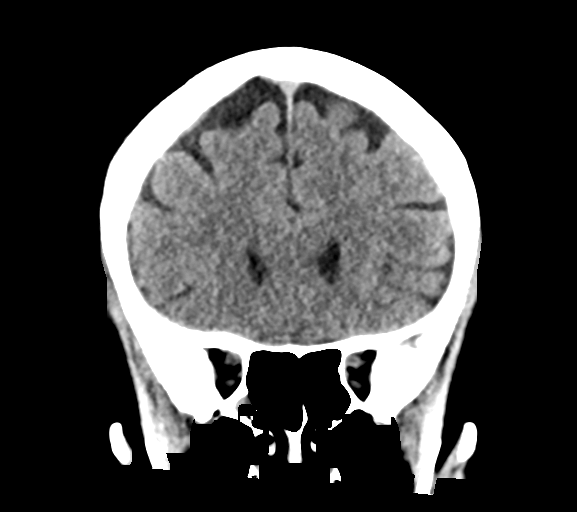
[im 29/66  brain]
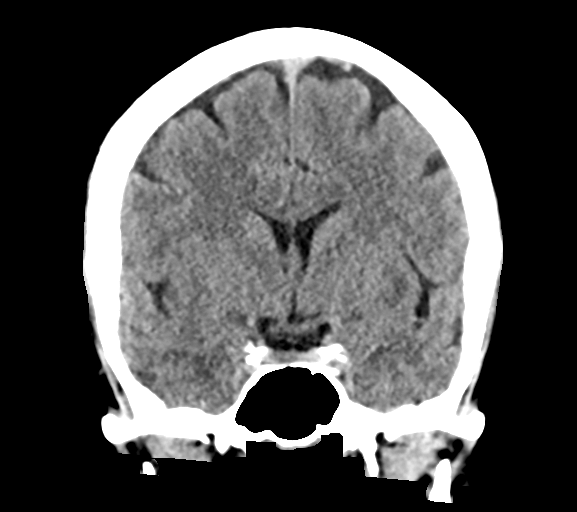
[im 37/66  brain]
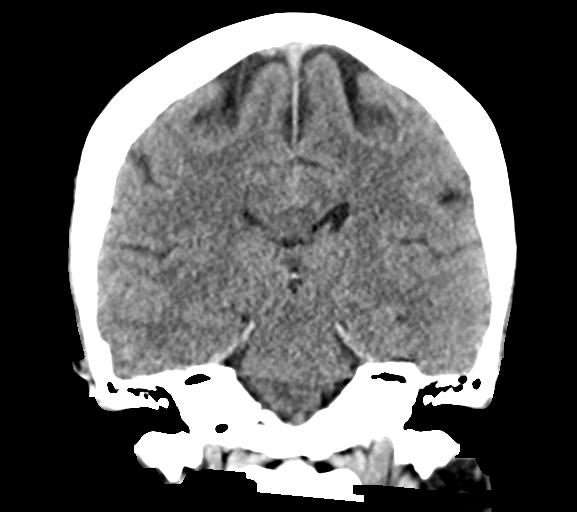

[Series 5: sag soft · sagittal · 0.31mm/px · 3 of 51 slices shown]
[im 17/51  brain]
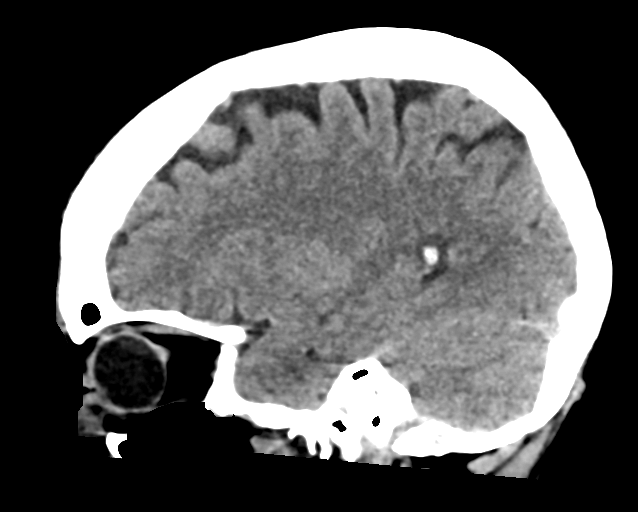
[im 26/51  brain]
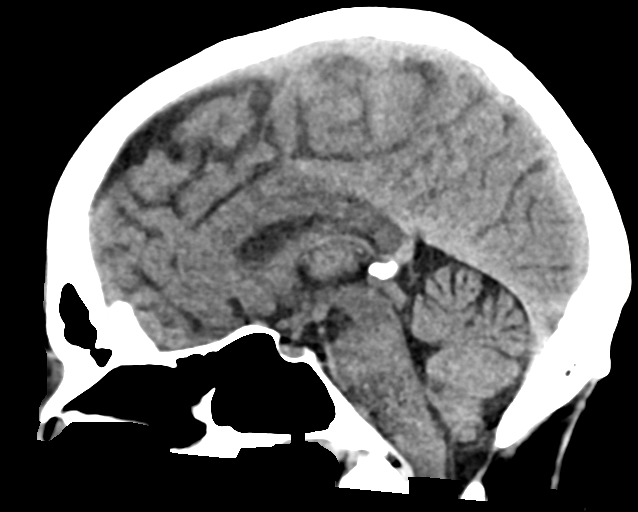
[im 34/51  brain]
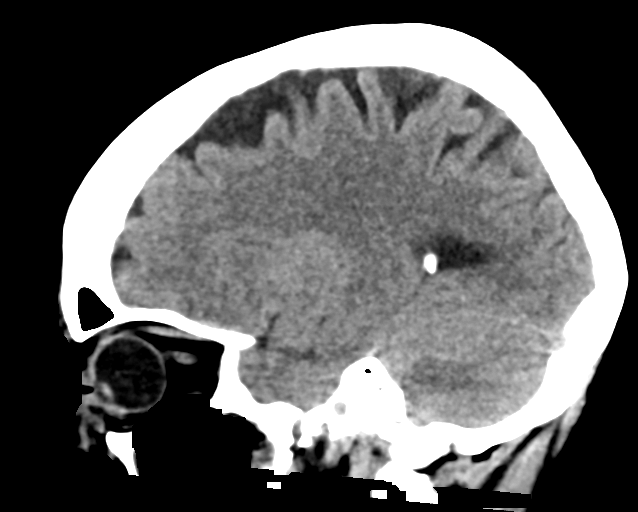

[16 of 47 positions shown; findings below may reference images not displayed]

FINDINGS: CT HEAD FINDINGS

Brain: No acute intracranial hemorrhage, mass effect, or edema.
Gray-white differentiation is preserved. No extra-axial collection.
Ventricles and sulci are normal in size and configuration.

Vascular: Mild intracranial atherosclerotic calcification at the
skull base.

Skull: Unremarkable.

Other: Mastoid air cells are clear.

CT MAXILLOFACIAL FINDINGS

Osseous: No acute facial fracture.

Orbits: No intraorbital hematoma.

Sinuses: Minor mucosal thickening.  Perforation of the nasal septum.

Soft tissues: Bilateral periorbital soft tissue swelling extending
into the left greater than right facial soft tissues.
IMPRESSION: No evidence of acute intracranial injury.

Bilateral periorbital and facial soft tissue swelling. No acute
facial fracture. No intraorbital hematoma.

Incidental perforation of the nasal septum.

## 2022-03-07 IMAGING — CR DG RIBS W/ CHEST 3+V*L*
5 series · 5 of 5 positions shown · non-contrast
Comparison: 08/08/2017

CLINICAL DATA: History of recent falls with chest pain, initial
encounter

EXAM:
LEFT RIBS AND CHEST - 3+ VIEW

[chest pa]
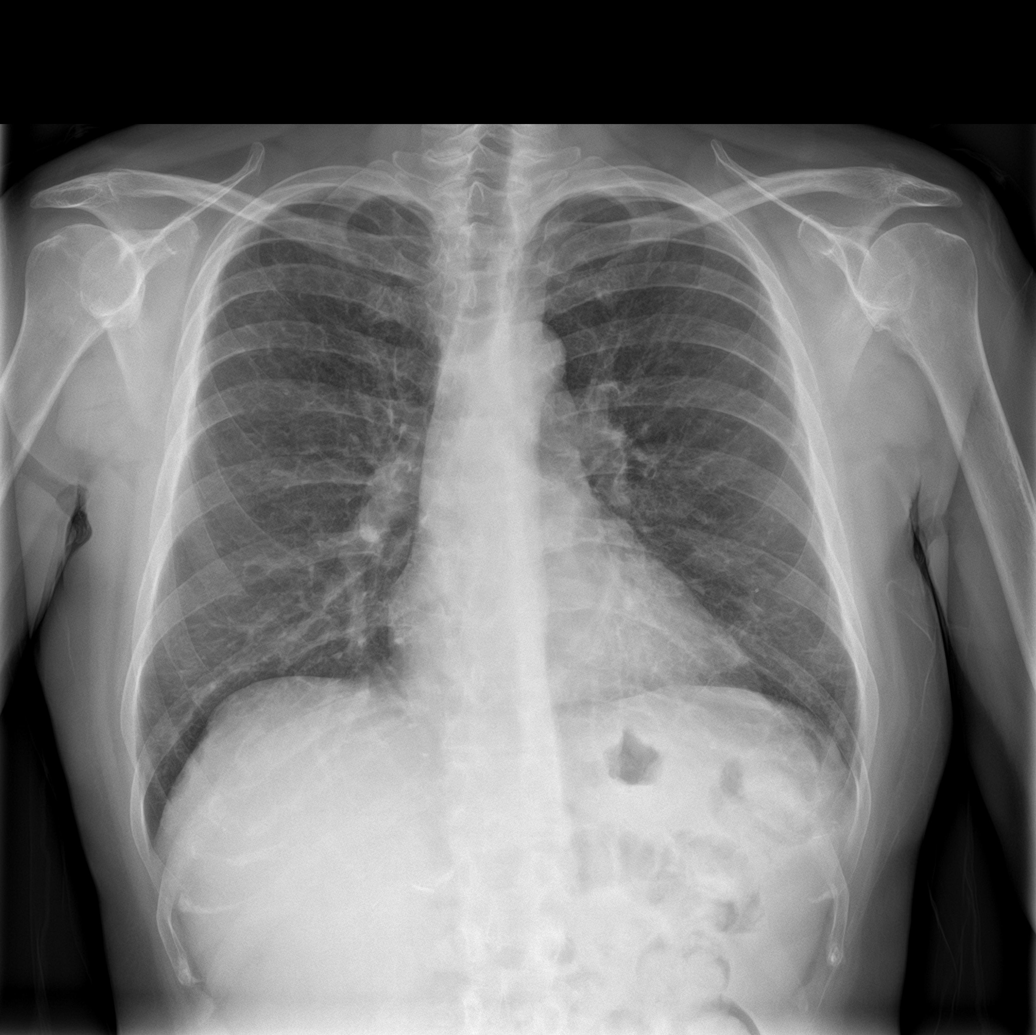

[rib pa (1 of 2)]
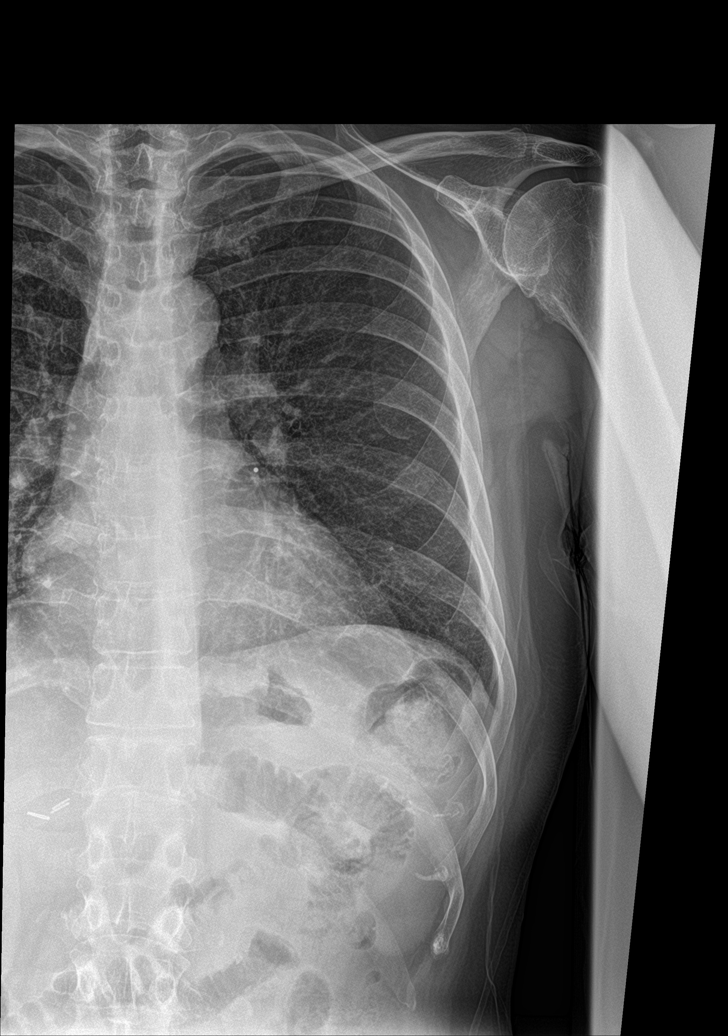

[rib pa obl (1 of 2)]
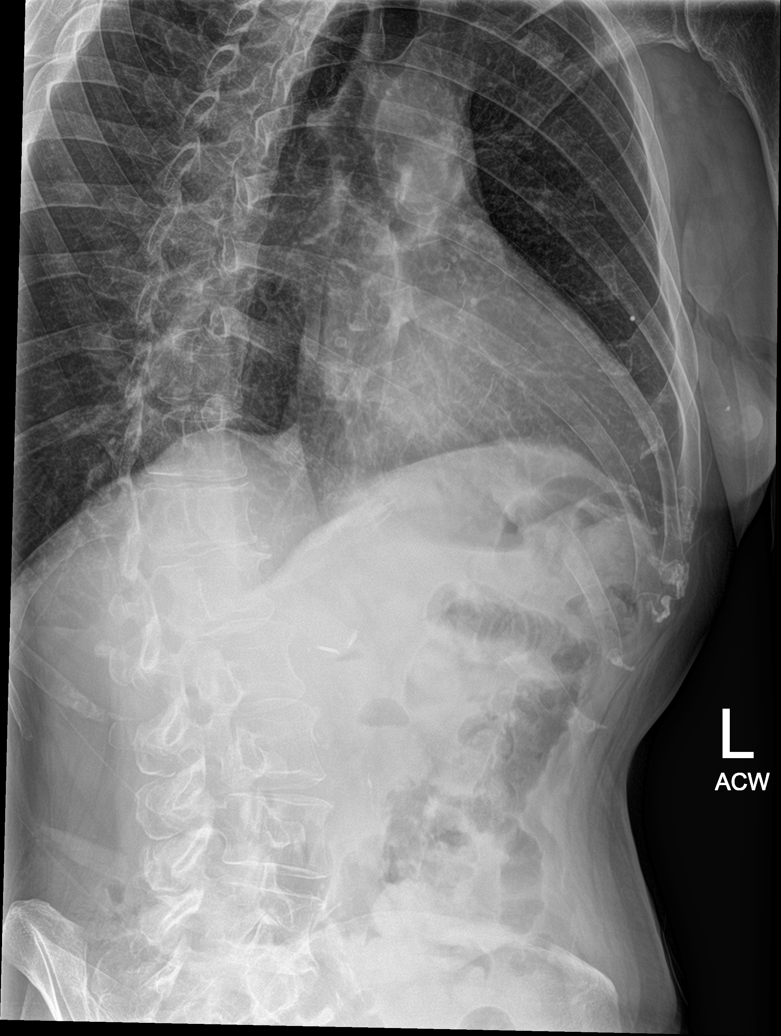

[rib pa (2 of 2)]
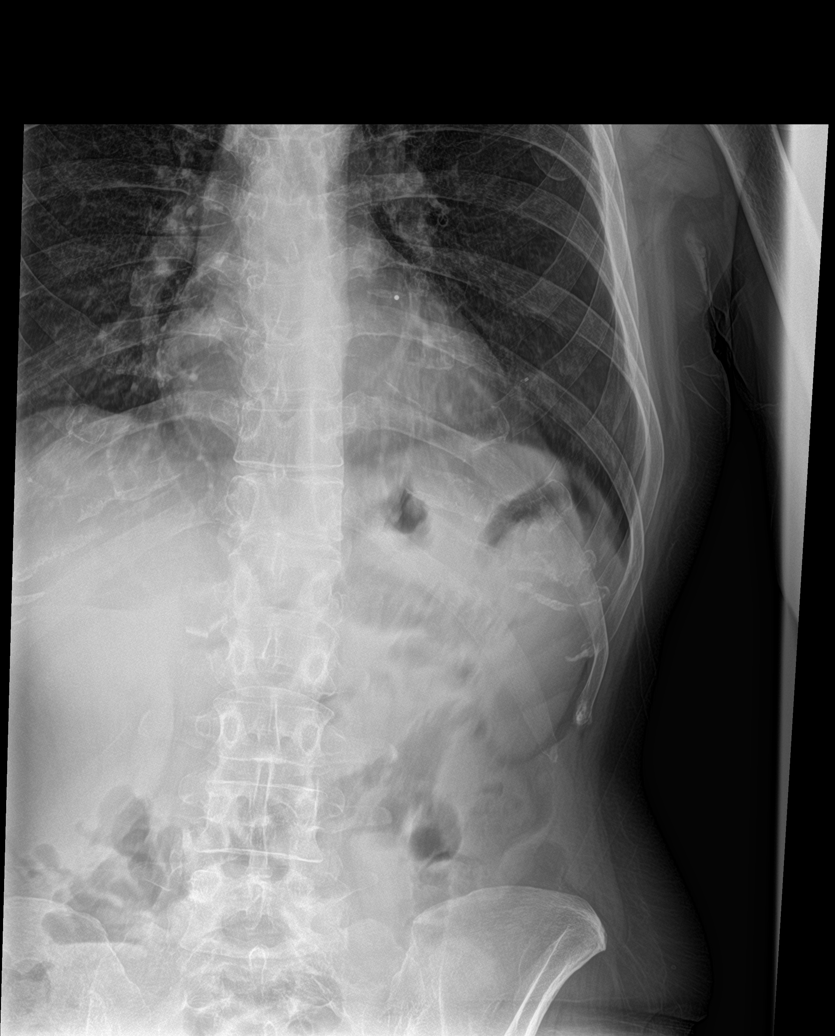

[rib pa obl (2 of 2)]
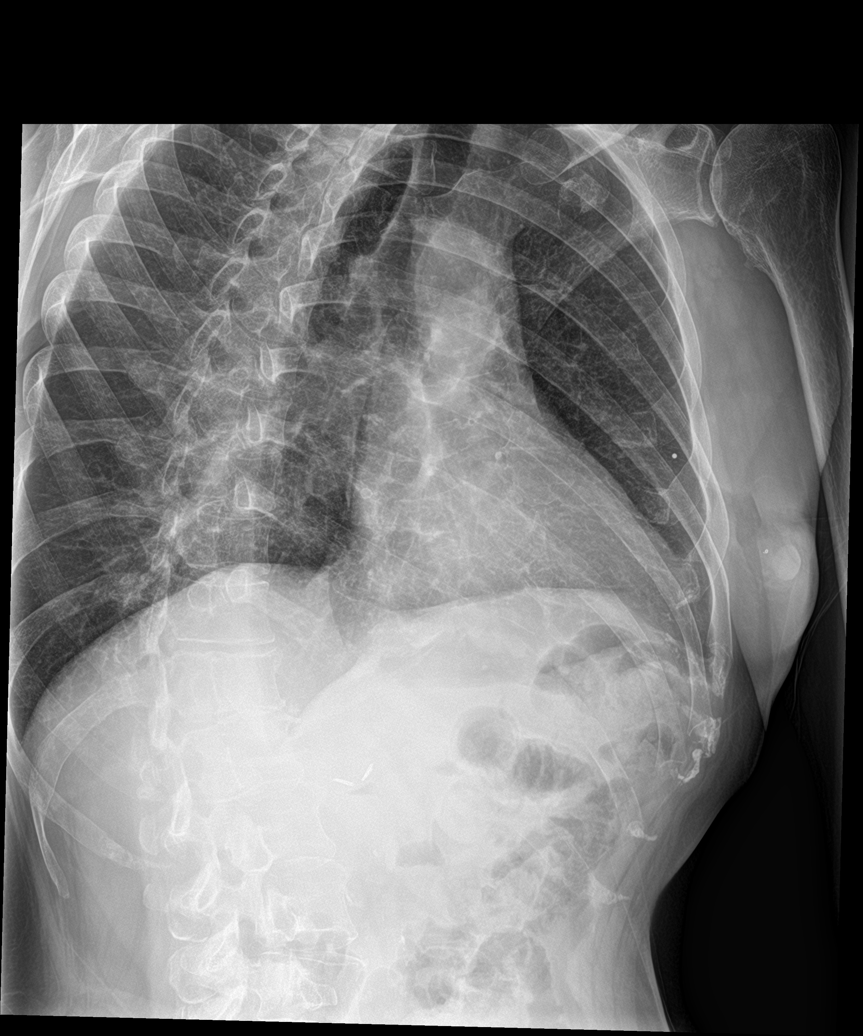

[5 of 5 positions shown; findings below may reference images not displayed]

FINDINGS: Cardiac shadow is within normal limits. The lungs are clear
bilaterally. No focal infiltrate is seen. No acute rib abnormality
noted.
IMPRESSION: Acute abnormality noted.

## 2022-04-15 DEATH — deceased
